# Patient Record
Sex: Male | Born: 1955 | Race: Black or African American | Hispanic: No | Marital: Married | Smoking: Former smoker
Health system: Southern US, Community
[De-identification: ages and names within clinical notes are randomized; demographics above are authoritative.]

## PROBLEM LIST (undated history)

## (undated) DIAGNOSIS — R011 Cardiac murmur, unspecified: Secondary | ICD-10-CM

## (undated) DIAGNOSIS — I10 Essential (primary) hypertension: Secondary | ICD-10-CM

## (undated) DIAGNOSIS — I82409 Acute embolism and thrombosis of unspecified deep veins of unspecified lower extremity: Secondary | ICD-10-CM

## (undated) DIAGNOSIS — M109 Gout, unspecified: Secondary | ICD-10-CM

## (undated) DIAGNOSIS — I2699 Other pulmonary embolism without acute cor pulmonale: Secondary | ICD-10-CM

## (undated) HISTORY — DX: Acute embolism and thrombosis of unspecified deep veins of unspecified lower extremity: I82.409

## (undated) HISTORY — DX: Essential (primary) hypertension: I10

## (undated) HISTORY — DX: Gout, unspecified: M10.9

## (undated) HISTORY — DX: Cardiac murmur, unspecified: R01.1

---

## 2011-09-10 HISTORY — PX: THYROIDECTOMY: SHX17

## 2018-07-09 ENCOUNTER — Encounter: Payer: Self-pay | Admitting: Urology

## 2018-07-09 ENCOUNTER — Ambulatory Visit (INDEPENDENT_AMBULATORY_CARE_PROVIDER_SITE_OTHER): Payer: Medicare Other | Admitting: Urology

## 2018-07-09 ENCOUNTER — Ambulatory Visit: Payer: Self-pay | Admitting: Urology

## 2018-07-09 VITALS — BP 127/79 | HR 76 | Ht 74.0 in | Wt 216.0 lb

## 2018-07-09 DIAGNOSIS — R972 Elevated prostate specific antigen [PSA]: Secondary | ICD-10-CM

## 2018-07-09 LAB — URINALYSIS, COMPLETE
BILIRUBIN UA: NEGATIVE
GLUCOSE, UA: NEGATIVE
Ketones, UA: NEGATIVE
LEUKOCYTES UA: NEGATIVE
Nitrite, UA: NEGATIVE
PROTEIN UA: NEGATIVE
RBC, UA: NEGATIVE
Specific Gravity, UA: >1.03 — ABNORMAL HIGH (ref 1.005–1.030)
Urobilinogen, Ur: 1 mg/dL (ref 0.2–1.0)
pH, UA: 5.5 (ref 5.0–7.5)

## 2018-07-09 NOTE — Progress Notes (Signed)
07/09/2018 6:04 PM   Fernando Cole 02/15/1956 [id number]  Referring provider: Sherron Monday, MD [address] [place], [place] [address]  CC: Elevated PSA  HPI: I had the pleasure of seeing Fernando Cole in urology clinic today in consultation for Dr. Avel Peace for elevated PSA.  He is a 62 year old African-American male with history of DVT/PE on Xarelto who recently moved to the area from [place].  He was found to have a PSA of 6.9 on screening with his PCP.  He was previously followed by urologist in [place], and he reports he underwent a prostate biopsy proximally 3 years ago for a PSA of around 5, which was negative.  He denies any family history of prostate cancer.  He has some mild urinary urgency and frequency, however denies any weak stream or significant urinary symptoms.  He was on Flomax previously, but has since stopped this medication.  There are no aggravating or alleviating factors.  Duration is greater than 3 years.  Severity is mild.  He denies gross hematuria, weight loss, or bone pain.  His outside records from [place] are unavailable to me today.   PMH: Past Medical History:  Diagnosis Date  . 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)   . Gout   . Heart murmur   . Hypertension     Surgical History: Past Surgical History:  Procedure Laterality Date  . THYROIDECTOMY  2013    Allergies: No Known Allergies  Family History: Family History  Problem Relation Age of Onset  . Prolactinoma Neg Hx   . Prostate cancer Neg Hx   . Kidney cancer Neg Hx     Social History:  reports that he has quit smoking. He has never used smokeless tobacco. He reports that he does not drink alcohol or use drugs.  ROS: Please see flowsheet from today's date for complete review of systems.  Physical Exam: BP 127/79   Pulse 76   Ht 6\' 2"  (1.88 m)   Wt 216 lb (98 kg)   BMI 27.73 kg/m    Constitutional:  Alert and oriented, No acute distress. Cardiovascular: No  clubbing, cyanosis, or edema. Respiratory: Normal respiratory effort, no increased work of breathing. GI: Abdomen is soft, nontender, nondistended, no abdominal masses GU: No CVA tenderness DRE: 70 g, smooth, no nodules Lymph: No cervical or inguinal lymphadenopathy. Skin: No rashes, bruises or suspicious lesions. Neurologic: Grossly intact, no focal deficits, moving all 4 extremities. Psychiatric: Normal mood and affect.  Laboratory Data: PSA 6.9  Urinalysis today 0 WBCs, 0 RBCs, nitrite negative, no bacteria  Pertinent Imaging: None to review  Assessment & Plan:   In summary, Fernando Cole is a very nice 62 year old African-American male with no family history of prostate cancer, anticoagulated on Xarelto for history of DVT and PE, with a recent PSA of 6.9, and history of negative prostate biopsy 3 years ago in [place].  We reviewed the implications of an elevated PSA and the uncertainty surrounding it. In general, a man's PSA increases with age and is produced by both normal and cancerous prostate tissue. The differential diagnosis for elevated PSA includes BPH, prostate cancer, infection, recent intercourse/ejaculation, recent urethroscopic manipulation (foley placement/cystoscopy) or trauma, and prostatitis.   Management of an elevated PSA can include observation or prostate biopsy and we discussed this in detail. Our goal is to detect clinically significant prostate cancers, and manage with either active surveillance, surgery, or radiation for localized disease. Risks of prostate biopsy include  bleeding, infection (including life threatening sepsis), pain, and lower urinary symptoms. Hematuria, hematospermia, and blood in the stool are all common after biopsy and can persist up to 4 weeks.   We will obtain his outside urology records from [place] Repeat PSA today with free to total ratio Will call with PSA results after reviewing outside records to determine next steps Will consider  prostate MRI  Return will call with PSA results and follow up plan.  Sondra Come, MD  [facility] [address] [place], [place] [address] [phone]

## 2018-07-10 LAB — PSA, TOTAL AND FREE
PSA FREE PCT: 22 %
PSA, Free: 4.94 ng/mL
Prostate Specific Ag, Serum: 22.5 ng/mL — ABNORMAL HIGH (ref 0.0–4.0)

## 2018-07-15 ENCOUNTER — Telehealth: Payer: Self-pay | Admitting: Family Medicine

## 2018-07-15 NOTE — Telephone Encounter (Signed)
LMOM for patient to return call. Need to know if he filled out Medical Release information.

## 2018-07-15 NOTE — Telephone Encounter (Signed)
I spoke with the patient and he let me know that he never filled out a medical release form so he is coming by the office to fill one out.   Fernando Cole

## 2018-07-15 NOTE — Telephone Encounter (Signed)
-----   Message from Brian C Sninsky, MD sent at 07/14/2018  3:20 PM EST ----- Were we able to obtain his outside urology records from [place] yet? Should include prostate biopsy results. Thanks  Brian Sninsky, MD 07/14/2018  

## 2018-07-15 NOTE — Telephone Encounter (Signed)
-----   Message from Sondra Come, MD sent at 07/14/2018  3:20 PM EST ----- Were we able to obtain his outside urology records from [place] yet? Should include prostate biopsy results. Thanks  Legrand Rams, MD 07/14/2018

## 2018-07-27 NOTE — Addendum Note (Signed)
Addended by: Sondra ComeSNINSKY, Labrina Lines C on: 07/27/2018 03:54 PM   Modules accepted: Orders

## 2018-07-29 NOTE — Telephone Encounter (Signed)
I have called the patient and left  Voicemail for  Him to cb.   Fernando Cole

## 2018-07-29 NOTE — Telephone Encounter (Signed)
-----   Message from Sondra ComeBrian C Sninsky, MD sent at 07/27/2018  3:54 PM EST ----- I have been unable to reach this patient by phone multiple times, and have left 2 voicemails.   Please schedule him for clinic visit with me next available with PSA prior.  Thanks Legrand RamsBrian Sninsky, MD 07/27/2018

## 2018-07-30 ENCOUNTER — Ambulatory Visit: Payer: Self-pay | Admitting: Urology

## 2018-08-18 ENCOUNTER — Other Ambulatory Visit: Payer: Medicare Other

## 2018-08-18 DIAGNOSIS — R972 Elevated prostate specific antigen [PSA]: Secondary | ICD-10-CM

## 2018-08-19 LAB — FPSA% REFLEX
% FREE PSA: 21.3 %
PSA, FREE: 1.77 ng/mL

## 2018-08-19 LAB — PSA TOTAL (REFLEX TO FREE): PROSTATE SPECIFIC AG, SERUM: 8.3 ng/mL — AB (ref 0.0–4.0)

## 2018-08-25 ENCOUNTER — Encounter: Payer: Self-pay | Admitting: Urology

## 2018-08-25 ENCOUNTER — Other Ambulatory Visit: Payer: Self-pay

## 2018-08-25 ENCOUNTER — Ambulatory Visit (INDEPENDENT_AMBULATORY_CARE_PROVIDER_SITE_OTHER): Payer: Medicare Other | Admitting: Urology

## 2018-08-25 VITALS — BP 129/84 | HR 78 | Wt 224.0 lb

## 2018-08-25 DIAGNOSIS — R972 Elevated prostate specific antigen [PSA]: Secondary | ICD-10-CM | POA: Diagnosis not present

## 2018-08-25 NOTE — Progress Notes (Signed)
   08/25/2018 11:08 AM   Hanley HaysJeffery Sinn 11/09/1955 [id number]  Reason for visit: Follow up elevated PSA  I saw Mr. Rosenbloom back in urology clinic today for elevated PSA.  He is a 62 year old African-American male who recently moved to the area from [place].  He does have some comorbidities including history of DVT/PE on Xarelto.  He has had extensive work-up for elevated PSA by his urologist in [place].  We were able to obtain these records.  To briefly summarize, he underwent a negative prostate biopsy in 2017 for an elevated PSA of 5, underwent a 4K score with a 4% risk of finding a clinically significant prostate cancer on repeat biopsy, as well as a negative prostate MRI in 2018 that showed no suspicious lesions.  Prostate volume was 72 cc on prostate MRI.  PSA density is 0.12 with his most recent PSA of 8.3(21% free).  We had a long conversation about PSA screening today.  He has had extensive work-up with his urologist in [place], all of which have been reassuring including 4K score of 4% and negative prostate MRI.  His PSA has been chronically elevated, and the percentage free of 21% is reassuring.  We discussed there is a very low, but not 0 risk of missing a clinically significant prostate cancer by avoiding biopsy at this time.  He is in agreement to continue PSA/DRE screening on a yearly basis.  If PSA rose significantly in the future, would consider repeating prostate MRI prior to undergoing biopsy.  RTC 1 year with PSA/DRE  A total of 15 minutes were spent face-to-face with the patient, greater than 50% was spent in patient education, counseling, and coordination of care regarding PSA screening, role of 4K score and MRI, and need for ongoing surveillance.  Sondra ComeBrian C Sninsky, MD  [facility] [address], [address] [place], [place] [address] [phone]

## 2019-08-23 ENCOUNTER — Other Ambulatory Visit: Payer: Medicare Other

## 2019-08-23 ENCOUNTER — Other Ambulatory Visit: Payer: Self-pay

## 2019-08-23 DIAGNOSIS — R972 Elevated prostate specific antigen [PSA]: Secondary | ICD-10-CM

## 2019-08-24 LAB — PSA: Prostate Specific Ag, Serum: 8.3 ng/mL — ABNORMAL HIGH (ref 0.0–4.0)

## 2019-08-25 ENCOUNTER — Ambulatory Visit: Payer: Medicare Other | Admitting: Urology

## 2019-08-26 ENCOUNTER — Ambulatory Visit (INDEPENDENT_AMBULATORY_CARE_PROVIDER_SITE_OTHER): Payer: Medicare Other | Admitting: Urology

## 2019-08-26 ENCOUNTER — Other Ambulatory Visit: Payer: Self-pay

## 2019-08-26 ENCOUNTER — Encounter: Payer: Self-pay | Admitting: Urology

## 2019-08-26 VITALS — BP 140/76 | HR 82 | Ht 74.0 in | Wt 220.0 lb

## 2019-08-26 DIAGNOSIS — R972 Elevated prostate specific antigen [PSA]: Secondary | ICD-10-CM

## 2019-08-26 NOTE — Patient Instructions (Signed)

## 2019-08-26 NOTE — Progress Notes (Signed)
   08/26/2019 9:41 AM   Fernando Cole 13-Jun-1956 [id number]  Reason for visit: Follow up elevated PSA  HPI: I saw Fernando Cole back in urology clinic today for elevated PSA.  He is a 63 year old African-American male who recently moved to the area from [place] a few years ago.  He does have some comorbidities including history of DVT/PE on Xarelto.  He has had extensive work-up for elevated PSA by his urologist in [place].  To briefly summarize, he underwent a negative prostate biopsy in 2017 for an elevated PSA of 5, underwent a 4K score with a 4% risk of finding a clinically significant prostate cancer on repeat biopsy, as well as a negative prostate MRI in 2018 that showed no suspicious lesions.  Prostate volume was 72 cc on prostate MRI.  PSA density is 0.12 with his most recent PSA of 8.3(21% free).  Most recent PSA on 08/23/2019 is 8.3, which is unchanged from last year.  He denies any complaints from last year including weight loss or bone pain.  DRE is benign today with a 75 g prostate with no masses or nodules  We had another long conversation about PSA screening today.  He has had extensive work-up with his urologist in [place], all of which have been reassuring including 4K score of 4% and negative prostate MRI.  His PSA has been chronically elevated, and the percentage free of 21% is reassuring.  We discussed there is a very low, but not 0 risk of missing a clinically significant prostate cancer by avoiding biopsy at this time.  He is in agreement to continue PSA/DRE screening on a yearly basis.  If PSA rose significantly in the future, would consider repeating prostate MRI prior to undergoing biopsy.  Repeat PSA with free/total ratio in 1 year  A total of 15 minutes were spent face-to-face with the patient, greater than 50% was spent in patient education, counseling, and coordination of care regarding PSA screening.  Billey Co, [facility] [address] [place],  [address] [phone]

## 2020-08-28 ENCOUNTER — Ambulatory Visit: Payer: Medicare Other | Admitting: Urology

## 2020-08-30 ENCOUNTER — Ambulatory Visit: Payer: Medicare Other | Admitting: Urology

## 2020-08-30 ENCOUNTER — Other Ambulatory Visit: Payer: Medicare Other

## 2020-08-30 ENCOUNTER — Other Ambulatory Visit: Payer: Self-pay

## 2020-08-30 DIAGNOSIS — R972 Elevated prostate specific antigen [PSA]: Secondary | ICD-10-CM

## 2020-08-31 LAB — PSA: Prostate Specific Ag, Serum: 23.7 ng/mL — ABNORMAL HIGH (ref 0.0–4.0)

## 2020-09-06 ENCOUNTER — Other Ambulatory Visit: Payer: Self-pay

## 2020-09-06 ENCOUNTER — Ambulatory Visit (INDEPENDENT_AMBULATORY_CARE_PROVIDER_SITE_OTHER): Payer: Medicare Other | Admitting: Urology

## 2020-09-06 ENCOUNTER — Encounter: Payer: Self-pay | Admitting: Urology

## 2020-09-06 VITALS — BP 129/73 | HR 76 | Ht 74.0 in | Wt 217.0 lb

## 2020-09-06 DIAGNOSIS — R972 Elevated prostate specific antigen [PSA]: Secondary | ICD-10-CM | POA: Diagnosis not present

## 2020-09-06 DIAGNOSIS — R3915 Urgency of urination: Secondary | ICD-10-CM

## 2020-09-06 LAB — MICROSCOPIC EXAMINATION
Bacteria, UA: NONE SEEN
Epithelial Cells (non renal): NONE SEEN /hpf (ref 0–10)

## 2020-09-06 LAB — URINALYSIS, COMPLETE
Bilirubin, UA: NEGATIVE
Glucose, UA: NEGATIVE
Ketones, UA: NEGATIVE
Leukocytes,UA: NEGATIVE
Nitrite, UA: NEGATIVE
Protein,UA: NEGATIVE
Specific Gravity, UA: 1.025 (ref 1.005–1.030)
Urobilinogen, Ur: 1 mg/dL (ref 0.2–1.0)
pH, UA: 5 (ref 5.0–7.5)

## 2020-09-06 LAB — BLADDER SCAN AMB NON-IMAGING

## 2020-09-06 NOTE — Progress Notes (Signed)
° °  09/06/2020 2:17 PM   Fernando Cole 1956/05/04 [id number]  Reason for visit: Follow up elevated PSA, urinary symptoms  HPI: He is a 64 year old African-American male from [place] with history of DVT/PE on Xarelto and long history of elevated PSA and extensive work-up. To briefly summarize, he underwent a negative prostate biopsy in 2017 for an elevated PSA of 5, underwent a 4K score with a 4% risk of finding a clinically significant prostate cancer on repeat biopsy, as well as a negative prostate MRI in 2018 that showed no suspicious lesions. Prostate volume was 72 cc on prostate MRI. PSA density is 0.12 with his PSA last year of 8.3(21% free).   Repeat PSA on 08/30/2020 was elevated at 23.7.  He does have a history of isolated elevated PSA, including a value of 22.5 on 07/09/2018 that dropped down to 8.3 on repeat in December 2019.  We discussed possible etiologies of a false elevation of the PSA.  DRE is benign today showing a 70 g gland with no nodules or masses.  He reports some mild intermittent urinary symptoms of occasional urgency and frequency, but denies any dysuria or gross hematuria.  He does think that he had multiple ejaculations prior to having his PSA drawn most recently.  Urinalysis today was benign with 0-5 WBCs, 0-2 RBCs, no bacteria, nitrite negative, no leukocytes.  PVR is normal at 8 mL.  We again reviewed his extensive work-up for history of elevated PSA including negative biopsy in 2017, 4K score with 4% risk of clinically significant prostate cancer, and negative prostate MRI in 2018.  DRE is additionally benign.  Suspect false elevation of PSA, and recommended a repeat in 4 to 6 weeks.  I encouraged him to abstain from ejaculations for at least 2 to 3 days prior to PSA lab draw.  Repeat PSA in 6 weeks, call with results   Sondra Come, MD  [facility] [address], [address] [place], [place] [address] [phone]

## 2020-09-06 NOTE — Patient Instructions (Addendum)
Prostate Cancer Screening  Prostate cancer screening is a test that is done to check for the presence of prostate cancer in men. The prostate gland is a walnut-sized gland that is located below the bladder and in front of the rectum in males. The function of the prostate is to add fluid to semen during ejaculation. Prostate cancer is the second most common type of cancer in men. Who should have prostate cancer screening?  Screening recommendations vary based on age and other risk factors. Screening is recommended if:  You are older than age 55. If you are age 55-69, talk with your health care provider about your need for screening and how often screening should be done. Because most prostate cancers are slow growing and will not cause death, screening is generally reserved in this age group for men who have a 10-15-year life expectancy.  You are younger than age 55, and you have these risk factors: ? Being a black male or a male of African descent. ? Having a father, brother, or uncle who has been diagnosed with prostate cancer. The risk is higher if your family member's cancer occurred at an early age. Screening is not recommended if:  You are younger than age 40.  You are between the ages of 40 and 54 and you have no risk factors.  You are 70 years of age or older. At this age, the risks that screening can cause are greater than the benefits that it may provide. If you are at high risk for prostate cancer, your health care provider may recommend that you have screenings more often or that you start screening at a younger age. How is screening for prostate cancer done? The recommended prostate cancer screening test is a blood test called the prostate-specific antigen (PSA) test. PSA is a protein that is made in the prostate. As you age, your prostate naturally produces more PSA. Abnormally high PSA levels may be caused by:  Prostate cancer.  An enlarged prostate that is not caused by cancer  (benign prostatic hyperplasia, BPH). This condition is very common in older men.  A prostate gland infection (prostatitis). Depending on the PSA results, you may need more tests, such as:  A physical exam to check the size of your prostate gland.  Blood and imaging tests.  A procedure to remove tissue samples from your prostate gland for testing (biopsy). What are the benefits of prostate cancer screening?  Screening can help to identify cancer at an early stage, before symptoms start and when the cancer can be treated more easily.  There is a small chance that screening may lower your risk of dying from prostate cancer. The chance is small because prostate cancer is a slow-growing cancer, and most men with prostate cancer die from a different cause. What are the risks of prostate cancer screening? The main risk of prostate cancer screening is diagnosing and treating prostate cancer that would never have caused any symptoms or problems. This is called overdiagnosisand overtreatment. PSA screening cannot tell you if your PSA is high due to cancer or a different cause. A prostate biopsy is the only procedure to diagnose prostate cancer. Even the results of a biopsy may not tell you if your cancer needs to be treated. Slow-growing prostate cancer may not need any treatment other than monitoring, so diagnosing and treating it may cause unnecessary stress or other side effects. A prostate biopsy may also cause:  Infection or fever.  A false negative. This is   a result that shows that you do not have prostate cancer when you actually do have prostate cancer. Questions to ask your health care provider  When should I start prostate cancer screening?  What is my risk for prostate cancer?  How often do I need screening?  What type of screening tests do I need?  How do I get my test results?  What do my results mean?  Do I need treatment? Where to find more information  The American Cancer  Society: www.cancer.org  American Urological Association: www.auanet.org Contact a health care provider if:  You have difficulty urinating.  You have pain when you urinate or ejaculate.  You have blood in your urine or semen.  You have pain in your back or in the area of your prostate. Summary  Prostate cancer is a common type of cancer in men. The prostate gland is located below the bladder and in front of the rectum. This gland adds fluid to semen during ejaculation.  Prostate cancer screening may identify cancer at an early stage, when the cancer can be treated more easily.  The prostate-specific antigen (PSA) test is the recommended screening test for prostate cancer.  Discuss the risks and benefits of prostate cancer screening with your health care provider. If you are age 70 or older, the risks that screening can cause are greater than the benefits that it may provide. This information is not intended to replace advice given to you by your health care provider. Make sure you discuss any questions you have with your health care provider. Document Revised: 04/08/2019 Document Reviewed: 04/08/2019 Elsevier Patient Education  2020 Elsevier Inc.  Prostate Cancer Screening  Prostate cancer screening is a test that is done to check for the presence of prostate cancer in men. The prostate gland is a walnut-sized gland that is located below the bladder and in front of the rectum in males. The function of the prostate is to add fluid to semen during ejaculation. Prostate cancer is the second most common type of cancer in men. Who should have prostate cancer screening?  Screening recommendations vary based on age and other risk factors. Screening is recommended if:  You are older than age 55. If you are age 55-69, talk with your health care provider about your need for screening and how often screening should be done. Because most prostate cancers are slow growing and will not cause  death, screening is generally reserved in this age group for men who have a 10-15-year life expectancy.  You are younger than age 55, and you have these risk factors: ? Being a black male or a male of African descent. ? Having a father, brother, or uncle who has been diagnosed with prostate cancer. The risk is higher if your family member's cancer occurred at an early age. Screening is not recommended if:  You are younger than age 40.  You are between the ages of 40 and 54 and you have no risk factors.  You are 70 years of age or older. At this age, the risks that screening can cause are greater than the benefits that it may provide. If you are at high risk for prostate cancer, your health care provider may recommend that you have screenings more often or that you start screening at a younger age. How is screening for prostate cancer done? The recommended prostate cancer screening test is a blood test called the prostate-specific antigen (PSA) test. PSA is a protein that is made in   the prostate. As you age, your prostate naturally produces more PSA. Abnormally high PSA levels may be caused by:  Prostate cancer.  An enlarged prostate that is not caused by cancer (benign prostatic hyperplasia, BPH). This condition is very common in older men.  A prostate gland infection (prostatitis). Depending on the PSA results, you may need more tests, such as:  A physical exam to check the size of your prostate gland.  Blood and imaging tests.  A procedure to remove tissue samples from your prostate gland for testing (biopsy). What are the benefits of prostate cancer screening?  Screening can help to identify cancer at an early stage, before symptoms start and when the cancer can be treated more easily.  There is a small chance that screening may lower your risk of dying from prostate cancer. The chance is small because prostate cancer is a slow-growing cancer, and most men with prostate cancer die  from a different cause. What are the risks of prostate cancer screening? The main risk of prostate cancer screening is diagnosing and treating prostate cancer that would never have caused any symptoms or problems. This is called overdiagnosisand overtreatment. PSA screening cannot tell you if your PSA is high due to cancer or a different cause. A prostate biopsy is the only procedure to diagnose prostate cancer. Even the results of a biopsy may not tell you if your cancer needs to be treated. Slow-growing prostate cancer may not need any treatment other than monitoring, so diagnosing and treating it may cause unnecessary stress or other side effects. A prostate biopsy may also cause:  Infection or fever.  A false negative. This is a result that shows that you do not have prostate cancer when you actually do have prostate cancer. Questions to ask your health care provider  When should I start prostate cancer screening?  What is my risk for prostate cancer?  How often do I need screening?  What type of screening tests do I need?  How do I get my test results?  What do my results mean?  Do I need treatment? Where to find more information  The American Cancer Society: www.cancer.org  American Urological Association: www.auanet.org Contact a health care provider if:  You have difficulty urinating.  You have pain when you urinate or ejaculate.  You have blood in your urine or semen.  You have pain in your back or in the area of your prostate. Summary  Prostate cancer is a common type of cancer in men. The prostate gland is located below the bladder and in front of the rectum. This gland adds fluid to semen during ejaculation.  Prostate cancer screening may identify cancer at an early stage, when the cancer can be treated more easily.  The prostate-specific antigen (PSA) test is the recommended screening test for prostate cancer.  Discuss the risks and benefits of prostate  cancer screening with your health care provider. If you are age 70 or older, the risks that screening can cause are greater than the benefits that it may provide. This information is not intended to replace advice given to you by your health care provider. Make sure you discuss any questions you have with your health care provider. Document Revised: 04/08/2019 Document Reviewed: 04/08/2019 Elsevier Patient Education  2020 Elsevier Inc.  

## 2020-09-07 ENCOUNTER — Telehealth: Payer: Self-pay

## 2020-09-07 NOTE — Telephone Encounter (Signed)
Called pt informed him of the information below. Pt gave verbal understanding.  

## 2020-09-07 NOTE — Telephone Encounter (Signed)
-----   Message from Sondra Come, MD sent at 09/07/2020  8:35 AM EST ----- No evidence of infection on urinalysis, keep follow-up as scheduled for repeat PSA  Legrand Rams, MD 09/07/2020

## 2020-09-17 ENCOUNTER — Other Ambulatory Visit: Payer: Self-pay

## 2020-09-17 DIAGNOSIS — Z7901 Long term (current) use of anticoagulants: Secondary | ICD-10-CM | POA: Insufficient documentation

## 2020-09-17 DIAGNOSIS — I10 Essential (primary) hypertension: Secondary | ICD-10-CM | POA: Insufficient documentation

## 2020-09-17 DIAGNOSIS — Z86711 Personal history of pulmonary embolism: Secondary | ICD-10-CM | POA: Diagnosis not present

## 2020-09-17 DIAGNOSIS — M25462 Effusion, left knee: Secondary | ICD-10-CM | POA: Insufficient documentation

## 2020-09-17 DIAGNOSIS — Z86718 Personal history of other venous thrombosis and embolism: Secondary | ICD-10-CM | POA: Insufficient documentation

## 2020-09-17 DIAGNOSIS — M25562 Pain in left knee: Secondary | ICD-10-CM | POA: Diagnosis present

## 2020-09-17 DIAGNOSIS — Z79899 Other long term (current) drug therapy: Secondary | ICD-10-CM | POA: Diagnosis not present

## 2020-09-17 DIAGNOSIS — Z87891 Personal history of nicotine dependence: Secondary | ICD-10-CM | POA: Diagnosis not present

## 2020-09-18 ENCOUNTER — Emergency Department: Payer: Medicare Other

## 2020-09-18 ENCOUNTER — Encounter: Payer: Self-pay | Admitting: Emergency Medicine

## 2020-09-18 ENCOUNTER — Emergency Department
Admission: EM | Admit: 2020-09-18 | Discharge: 2020-09-18 | Disposition: A | Discharge status: Home / Self Care | Payer: Medicare Other | Attending: Emergency Medicine | Admitting: Emergency Medicine

## 2020-09-18 DIAGNOSIS — M25462 Effusion, left knee: Secondary | ICD-10-CM

## 2020-09-18 HISTORY — DX: Other pulmonary embolism without acute cor pulmonale: I26.99

## 2020-09-18 LAB — CBC WITH DIFFERENTIAL/PLATELET
Abs Immature Granulocytes: 0.04 10*3/uL (ref 0.00–0.07)
Basophils Absolute: 0 10*3/uL (ref 0.0–0.1)
Basophils Relative: 0 %
Eosinophils Absolute: 0 10*3/uL (ref 0.0–0.5)
Eosinophils Relative: 0 %
HCT: 39.8 % (ref 39.0–52.0)
Hemoglobin: 13.7 g/dL (ref 13.0–17.0)
Immature Granulocytes: 0 %
Lymphocytes Relative: 21 %
Lymphs Abs: 2.2 10*3/uL (ref 0.7–4.0)
MCH: 32.8 pg (ref 26.0–34.0)
MCHC: 34.4 g/dL (ref 30.0–36.0)
MCV: 95.2 fL (ref 80.0–100.0)
Monocytes Absolute: 0.9 10*3/uL (ref 0.1–1.0)
Monocytes Relative: 8 %
Neutro Abs: 7.5 10*3/uL (ref 1.7–7.7)
Neutrophils Relative %: 71 %
Platelets: 287 10*3/uL (ref 150–400)
RBC: 4.18 MIL/uL — ABNORMAL LOW (ref 4.22–5.81)
RDW: 12.3 % (ref 11.5–15.5)
WBC: 10.7 10*3/uL — ABNORMAL HIGH (ref 4.0–10.5)
nRBC: 0 % (ref 0.0–0.2)

## 2020-09-18 LAB — SYNOVIAL CELL COUNT + DIFF, W/ CRYSTALS
Crystals, Fluid: NONE SEEN
Eosinophils-Synovial: 0 %
Lymphocytes-Synovial Fld: 49 %
Monocyte-Macrophage-Synovial Fluid: 2 %
Neutrophil, Synovial: 49 %
WBC, Synovial: 494 /mm3 — ABNORMAL HIGH (ref 0–200)

## 2020-09-18 LAB — BASIC METABOLIC PANEL
Anion gap: 10 (ref 5–15)
BUN: 13 mg/dL (ref 8–23)
CO2: 29 mmol/L (ref 22–32)
Calcium: 9.4 mg/dL (ref 8.9–10.3)
Chloride: 100 mmol/L (ref 98–111)
Creatinine, Ser: 1.16 mg/dL (ref 0.61–1.24)
GFR, Estimated: >60 mL/min (ref >60)
Glucose, Bld: 104 mg/dL — ABNORMAL HIGH (ref 70–99)
Potassium: 3.6 mmol/L (ref 3.5–5.1)
Sodium: 139 mmol/L (ref 135–145)

## 2020-09-18 LAB — C-REACTIVE PROTEIN: CRP: 1.7 mg/dL — ABNORMAL HIGH (ref <1.0)

## 2020-09-18 LAB — SEDIMENTATION RATE: Sed Rate: 17 mm/hr — ABNORMAL HIGH (ref 0–16)

## 2020-09-18 LAB — URIC ACID: Uric Acid, Serum: 8.2 mg/dL (ref 3.7–8.6)

## 2020-09-18 MED ORDER — CEPHALEXIN 500 MG PO CAPS
500.0000 mg | ORAL_CAPSULE | Freq: Once | ORAL | Status: AC
  Administered 2020-09-18: 500 mg via ORAL
  Filled 2020-09-18: qty 1

## 2020-09-18 MED ORDER — LIDOCAINE HCL (PF) 1 % IJ SOLN
5.0000 mL | Freq: Once | INTRAMUSCULAR | Status: AC
  Administered 2020-09-18: 5 mL via INTRADERMAL
  Filled 2020-09-18: qty 5

## 2020-09-18 MED ORDER — OXYCODONE-ACETAMINOPHEN 5-325 MG PO TABS
1.0000 | ORAL_TABLET | Freq: Once | ORAL | Status: AC
  Administered 2020-09-18: 1 via ORAL
  Filled 2020-09-18: qty 1

## 2020-09-18 MED ORDER — TRAMADOL HCL 50 MG PO TABS
50.0000 mg | ORAL_TABLET | Freq: Four times a day (QID) | ORAL | 0 refills | Status: AC | PRN
Start: 2020-09-18 — End: 2021-09-18

## 2020-09-18 MED ORDER — DICLOFENAC SODIUM 1 % EX GEL: 2.0000 g | Freq: Four times a day (QID) | CUTANEOUS | 0 refills | Status: DC

## 2020-09-18 MED ORDER — CEPHALEXIN 500 MG PO CAPS: 500.0000 mg | ORAL_CAPSULE | Freq: Three times a day (TID) | ORAL | 0 refills | Status: AC

## 2020-09-18 NOTE — ED Provider Notes (Signed)
[facility] Emergency Department Provider Note  ____________________________________________  Time seen: Approximately 4:41 AM  I have reviewed the triage vital signs and the nursing notes.   HISTORY  Chief Complaint Leg Pain   HPI Fernando Cole is a 65 y.o. 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  Patient reports 2 days of progressively worsening swelling, redness and pain of his left knee.  He is still able to ambulate and bear weight but the pain is worse with movement of the joint.  No fever or chills.  Patient has a history of gout but has never had a flare in his knee before.  Has had it in his foot and ankle in the past.  No nausea or vomiting, no chills.  Patient Dors is compliance with his Xarelto.  No chest pain or shortness of breath.  Past Medical History:  Diagnosis Date  . 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)   . Gout   . Heart murmur   . Hypertension   . PE (pulmonary thromboembolism) (HCC)     There are no problems to display for this patient.   Past Surgical History:  Procedure Laterality Date  . THYROIDECTOMY  2013    Prior to Admission medications   Medication Sig Start Date End Date Taking? Authorizing Provider  cephALEXin (KEFLEX) 500 MG capsule Take 1 capsule (500 mg total) by mouth 3 (three) times daily for 7 days. 09/18/20 09/25/20 Yes Zenola Dezarn, [place], MD  diclofenac Sodium (VOLTAREN) 1 % GEL Apply 2 g topically 4 (four) times daily. 09/18/20  Yes Jakyron Fabro, [place], MD  traMADol (ULTRAM) 50 MG tablet Take 1 tablet (50 mg total) by mouth every 6 (six) hours as needed. 09/18/20 09/18/21 Yes Jadavion Spoelstra, [place], MD  amLODipine-benazepril (LOTREL) 5-40 MG capsule Take 1 capsule by mouth daily.    [provider]  folic acid (FOLVITE) 800 MCG tablet Take 400 mcg by mouth daily.    [provider]  hydrALAZINE (APRESOLINE) 25 MG tablet Take  25 mg by mouth 3 (three) times daily.    [provider]  levothyroxine (SYNTHROID, LEVOTHROID) 100 MCG tablet Take 100 mcg by mouth daily before breakfast.    [provider]  rivaroxaban (XARELTO) 10 MG TABS tablet Take 10 mg by mouth daily.    [provider]  VITAMIN D PO Take by mouth.    [provider]    Allergies Patient has no known allergies.  Family History  Problem Relation Age of Onset  . Prolactinoma Neg Hx   . Prostate cancer Neg Hx   . Kidney cancer Neg Hx     Social History Social History   Tobacco Use  . Smoking status: Former Games developermoker  . Smokeless tobacco: Never Used  Substance Use Topics  . Alcohol use: Never  . Drug use: Never    Review of Systems  Constitutional: Negative for fever. Eyes: Negative for visual changes. ENT: Negative for sore throat. Neck: No neck pain  Cardiovascular: Negative for chest pain. Respiratory: Negative for shortness of breath. Gastrointestinal: Negative for abdominal pain, vomiting or diarrhea. Genitourinary: Negative for dysuria. Musculoskeletal: Negative for back pain. + L knee swelling and pain Skin: Negative for rash. Neurological: Negative for headaches, weakness or numbness. Psych: No SI or HI  ____________________________________________   PHYSICAL EXAM:  VITAL SIGNS: Vitals:   09/18/20 0002 09/18/20 0350  BP: (!) 144/120 (!) 142/78  Pulse: Marland Kitchen(!)  118 86  Resp: 18 18  Temp: 98.7 F (37.1 C)   SpO2: 94% 96%    Constitutional: Alert and oriented. Well appearing and in no apparent distress. HEENT:      Head: Normocephalic and atraumatic.         Eyes: Conjunctivae are normal. Sclera is non-icteric.       Mouth/Throat: Mucous membranes are moist.       Neck: Supple with no signs of meningismus. Cardiovascular: Regular rate and rhythm. No murmurs, gallops, or rubs. 2+ symmetrical distal pulses are present in all extremities. No JVD. Respiratory: Normal respiratory  effort. Lungs are clear to auscultation bilaterally. No wheezes, crackles, or rhonchi.  Gastrointestinal: Soft, non tender. Musculoskeletal: L knee is swollen with erythema and warmth, ROM is slightly reduced due to pain but patient able to fully straighten the leg and bend to 90 degrees. Neurologic: Normal speech and language. Face is symmetric. Moving all extremities. No gross focal neurologic deficits are appreciated. Skin: Skin is warm, dry and intact. No rash noted. Psychiatric: Mood and affect are normal. Speech and behavior are normal.  ____________________________________________   LABS (all labs ordered are listed, but only abnormal results are displayed)  Labs Reviewed  SYNOVIAL CELL COUNT + DIFF, W/ CRYSTALS - Abnormal; Notable for the following components:      Result Value   Color, Synovial RED (*)    Appearance-Synovial CLOUDY (*)    WBC, Synovial 494 (*)    All other components within normal limits  CBC WITH DIFFERENTIAL/PLATELET - Abnormal; Notable for the following components:   WBC 10.7 (*)    RBC 4.18 (*)    All other components within normal limits  BASIC METABOLIC PANEL - Abnormal; Notable for the following components:   Glucose, Bld 104 (*)    All other components within normal limits  BODY FLUID CULTURE  URIC ACID  SEDIMENTATION RATE  C-REACTIVE PROTEIN   ____________________________________________  EKG  none  ____________________________________________  RADIOLOGY  I have personally reviewed the images performed during this visit and I agree with the Radiologist's read.   Interpretation by Radiologist:  US Venous Img Lower Unilateral Left  Result Date: 09/18/2020 CLINICAL DATA:  Initial evaluation for acute pain and swelling. EXAM: LEFT LOWER EXTREMITY VENOUS DOPPLER ULTRASOUND TECHNIQUE: Gray-scale sonography with graded compression, as well as color Doppler and duplex ultrasound were performed to evaluate the lower extremity deep venous systems  from the level of the common femoral vein and including the common femoral, femoral, profunda femoral, popliteal and calf veins including the posterior tibial, peroneal and gastrocnemius veins when visible. The superficial great saphenous vein was also interrogated. Spectral Doppler was utilized to evaluate flow at rest and with distal augmentation maneuvers in the common femoral, femoral and popliteal veins. COMPARISON:  None. FINDINGS: Contralateral Common Femoral Vein: Respiratory phasicity is normal and symmetric with the symptomatic side. No evidence of thrombus. Normal compressibility. Common Femoral Vein: No evidence of thrombus. Normal compressibility, respiratory phasicity and response to augmentation. Saphenofemoral Junction: No evidence of thrombus. Normal compressibility and flow on color Doppler imaging. Profunda Femoral Vein: No evidence of thrombus. Normal compressibility and flow on color Doppler imaging. Femoral Vein: No evidence of thrombus. Normal compressibility, respiratory phasicity and response to augmentation. Popliteal Vein: No evidence of acute thrombus. Normal compressibility. Linear echogenic density seen within the left popliteal vein could reflect a fibrinous band, possibly related to prior thrombus (image 21). Calf Veins: No evidence of thrombus. Normal compressibility and flow on color  Doppler imaging. Superficial Great Saphenous Vein: No evidence of thrombus. Normal compressibility. Venous Reflux:  None. Other Findings: Trace fluid noted about the knee, suspected to reflect a small joint effusion. IMPRESSION: 1. No evidence of acute deep venous thrombosis. 2. Trace fluid about the left knee, suspected to reflect a small joint effusion. Electronically Signed   By: Rise Mu M.D.   On: 09/18/2020 01:08     ____________________________________________   PROCEDURES  Procedure(s) performed:yes .Joint Aspiration/Arthrocentesis  Date/Time: 09/18/2020 4:44 AM Performed  by: Nita Sickle, MD Authorized by: Nita Sickle, MD   Consent:    Consent obtained:  Verbal   Consent given by:  Patient   Risks, benefits, and alternatives were discussed: yes     Risks discussed:  Bleeding, incomplete drainage, pain, nerve damage and infection   Alternatives discussed:  Observation Universal protocol:    Procedure explained and questions answered to patient or proxy's satisfaction: yes     Imaging studies available: yes     Patient identity confirmed:  Verbally with patient Location:    Location:  Knee   Knee:  L knee Anesthesia:    Anesthesia method:  Local infiltration   Local anesthetic:  Lidocaine 1% w/o epi Procedure details:    Preparation: Patient was prepped and draped in usual sterile fashion     Needle gauge:  22 G   Ultrasound guidance: yes     Approach:  Lateral   Aspirate amount:  3   Aspirate characteristics:  Yellow   Steroid injected: no     Specimen collected: yes   Post-procedure details:    Dressing:  Adhesive bandage   Procedure completion:  Tolerated   Critical Care performed:  None ____________________________________________   INITIAL IMPRESSION / ASSESSMENT AND PLAN / ED COURSE  65 y.o. 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  Patient with swelling, erythema and warmth of the left knee with slightly diminished range of motion due to pain.  Afebrile with normal vital signs otherwise.  Ddx gout versus inflammatory joint versus septic joint versus hemarthrosis versus DVT. Patient with pain with ROM therefore concerning more for a joint pathology than cellulitis although erythema and warmth could be due to cellulitis.  Doppler negative for DVT, visualized by me and confirmed by radiology.  Arthrocentesis done per procedure note above with yellow aspirate and a small amount of blood.  Sent to lab for cell count, crystal count, and culture. Will give  percocet for pain. Basic labs pending. Old medical records reviewed.   _________________________ 6:05 AM on 09/18/2020 -----------------------------------------  No crystals seen. Normal uric acid. WBC 494 therefore not consistent with inflammatory or septic joint. Fluid sent for culture. Will put patient on keflex in case this is a skin infection, voltaren gel/ tylenol/ tramadol for pain. Discussed close fu with PCP and my standard return precautions.      _____________________________________________ Please note:  Patient was evaluated in Emergency Department today for the symptoms described in the history of present illness. Patient was evaluated in the context of the global COVID-19 pandemic, which necessitated consideration that the patient might be at risk for infection with the SARS-CoV-2 virus that causes COVID-19. Institutional protocols and algorithms that pertain to the evaluation of patients at risk for COVID-19 are in a state of rapid change based on information released by regulatory bodies including the CDC and federal and state organizations. These policies and algorithms were followed during  the patient's care in the ED.  Some ED evaluations and interventions may be delayed as a result of limited staffing during the pandemic.   [place] Controlled Substance Database was reviewed by me. ____________________________________________   FINAL CLINICAL IMPRESSION(S) / ED DIAGNOSES   Final diagnoses:  Effusion of left knee      NEW MEDICATIONS STARTED DURING THIS VISIT:  ED Discharge Orders         Ordered    diclofenac Sodium (VOLTAREN) 1 % GEL  4 times daily        09/18/20 0602    traMADol (ULTRAM) 50 MG tablet  Every 6 hours PRN        09/18/20 0602    cephALEXin (KEFLEX) 500 MG capsule  3 times daily        09/18/20 0602           Note:  This document was prepared using Dragon voice recognition software and may include unintentional dictation errors.     Nita Sickle, MD 09/18/20 [phone]

## 2020-09-18 NOTE — ED Triage Notes (Signed)
Patient with pain and swelling behind left knee that started on Friday. Patient has a history of DVT and PE. Patient is currently taking blood thinners.

## 2020-09-18 NOTE — Discharge Instructions (Addendum)
The liquid from the knee did not show any signs of infection or gout. It could be just an inflammation of the knee or skin infection overlying the knee. Take keflex as prescribed 3 times a day for 7 days. Apply voltaren gel up to 4 times a day and massage it over the knee for pain relief. Take 1000mg  of tylenol 3 times a day for pain and 50mg  of tramadol every 4 hours as needed for breakthrough pain. Follow up with your doctor in 3 days for re-evaluation. Return to the ER for worsening pain, swelling, redness, or fever.

## 2020-09-30 LAB — BODY FLUID CULTURE

## 2020-10-19 ENCOUNTER — Other Ambulatory Visit: Payer: Self-pay

## 2020-10-31 ENCOUNTER — Other Ambulatory Visit: Payer: Medicare Other

## 2020-10-31 ENCOUNTER — Other Ambulatory Visit: Payer: Self-pay

## 2020-10-31 DIAGNOSIS — R972 Elevated prostate specific antigen [PSA]: Secondary | ICD-10-CM

## 2020-11-01 LAB — FPSA% REFLEX
% FREE PSA: 20.7 %
PSA, FREE: 1.84 ng/mL

## 2020-11-01 LAB — PSA TOTAL (REFLEX TO FREE): Prostate Specific Ag, Serum: 8.9 ng/mL — ABNORMAL HIGH (ref 0.0–4.0)

## 2020-11-02 ENCOUNTER — Telehealth: Payer: Self-pay

## 2020-11-02 NOTE — Telephone Encounter (Signed)
Called pt informed him of the information below. Pt gave verbal understanding. Appt previously scheduled.

## 2020-11-02 NOTE — Telephone Encounter (Signed)
-----   Message from Sondra Come, MD sent at 11/02/2020  7:29 AM EST ----- Randie Heinz news, PSA back down to his baseline of 8, keep schedule follow up in December for IPSS and PSA, thanks  Legrand Rams, MD 11/02/2020

## 2021-08-27 ENCOUNTER — Other Ambulatory Visit: Payer: Medicare Other

## 2021-08-30 ENCOUNTER — Other Ambulatory Visit: Payer: Self-pay

## 2021-08-30 ENCOUNTER — Other Ambulatory Visit: Payer: Medicare Other

## 2021-08-30 DIAGNOSIS — R972 Elevated prostate specific antigen [PSA]: Secondary | ICD-10-CM

## 2021-08-31 ENCOUNTER — Other Ambulatory Visit: Payer: Self-pay

## 2021-08-31 LAB — FPSA% REFLEX
% FREE PSA: 20.2 %
PSA, FREE: 1.92 ng/mL

## 2021-08-31 LAB — PSA TOTAL (REFLEX TO FREE): Prostate Specific Ag, Serum: 9.5 ng/mL — ABNORMAL HIGH (ref 0.0–4.0)

## 2021-09-05 ENCOUNTER — Ambulatory Visit: Payer: Self-pay | Admitting: Urology

## 2021-09-07 ENCOUNTER — Encounter: Payer: Self-pay | Admitting: Urology

## 2021-09-07 ENCOUNTER — Ambulatory Visit (INDEPENDENT_AMBULATORY_CARE_PROVIDER_SITE_OTHER): Payer: Medicare Other | Admitting: Urology

## 2021-09-07 ENCOUNTER — Other Ambulatory Visit: Payer: Self-pay

## 2021-09-07 VITALS — BP 142/83 | HR 69 | Ht 74.0 in | Wt 215.8 lb

## 2021-09-07 DIAGNOSIS — R3915 Urgency of urination: Secondary | ICD-10-CM | POA: Diagnosis not present

## 2021-09-07 DIAGNOSIS — N529 Male erectile dysfunction, unspecified: Secondary | ICD-10-CM | POA: Diagnosis not present

## 2021-09-07 DIAGNOSIS — R972 Elevated prostate specific antigen [PSA]: Secondary | ICD-10-CM

## 2021-09-07 LAB — URINALYSIS, COMPLETE
Bilirubin, UA: NEGATIVE
Glucose, UA: NEGATIVE
Ketones, UA: NEGATIVE
Leukocytes,UA: NEGATIVE
Nitrite, UA: NEGATIVE
Protein,UA: NEGATIVE
RBC, UA: NEGATIVE
Specific Gravity, UA: 1.025 (ref 1.005–1.030)
Urobilinogen, Ur: 0.2 mg/dL (ref 0.2–1.0)
pH, UA: 5.5 (ref 5.0–7.5)

## 2021-09-07 LAB — MICROSCOPIC EXAMINATION
Bacteria, UA: NONE SEEN
RBC, Urine: NONE SEEN /hpf (ref 0–2)

## 2021-09-07 MED ORDER — TADALAFIL 5 MG PO TABS: 5.0000 mg | ORAL_TABLET | Freq: Every day | ORAL | 6 refills | Status: DC | PRN

## 2021-09-07 NOTE — Patient Instructions (Signed)
Prostate Cancer Screening ?Prostate cancer screening is testing that is done to check for the presence of prostate cancer in men. The prostate gland is a walnut-sized gland that is located below the bladder and in front of the rectum in males. The function of the prostate is to add fluid to semen during ejaculation. Prostate cancer is one of the most common types of cancer in men. ?Who should have prostate cancer screening? ?Screening recommendations vary based on age and other risk factors, as well as between the professional organizations who make the recommendations. ?In general, screening is recommended if: ?You are age 50 to 70 and have an average risk for prostate cancer. You should talk with your health care provider about your need for screening and how often screening should be done. Because most prostate cancers are slow growing and will not cause death, screening in this age group is generally reserved for men who have a 10- to 15-year life expectancy. ?You are younger than age 50, and you have these risk factors: ?Having a father, brother, or uncle who has been diagnosed with prostate cancer. The risk is higher if your family member's cancer occurred at an early age or if you have multiple family members with prostate cancer at an early age. ?Being a male who is Black or is of Caribbean or sub-Saharan African descent. ?In general, screening is not recommended if: ?You are younger than age 40. ?You are between the ages of 40 and 49 and you have no risk factors. ?You are 70 years of age or older. At this age, the risks that screening can cause are greater than the benefits that it may provide. ?If you are at high risk for prostate cancer, your health care provider may recommend that you have screenings more often or that you start screening at a younger age. ?How is screening for prostate cancer done? ?The recommended prostate cancer screening test is a blood test called the prostate-specific antigen (PSA)  test. PSA is a protein that is made in the prostate. As you age, your prostate naturally produces more PSA. Abnormally high PSA levels may be caused by: ?Prostate cancer. ?An enlarged prostate that is not caused by cancer (benign prostatic hyperplasia, or BPH). This condition is very common in older men. ?A prostate gland infection (prostatitis) or urinary tract infection. ?Certain medicines such as male hormones (like testosterone) or other medicines that raise testosterone levels. ?A rectal exam may be done as part of prostate cancer screening to help provide information about the size of your prostate gland. When a rectal exam is performed, it should be done after the PSA level is drawn to avoid any effect on the results. ?Depending on the PSA results, you may need more tests, such as: ?A physical exam to check the size of your prostate gland, if not done as part of screening. ?Blood and imaging tests. ?A procedure to remove tissue samples from your prostate gland for testing (biopsy). This is the only way to know for certain if you have prostate cancer. ?What are the benefits of prostate cancer screening? ?Screening can help to identify cancer at an early stage, before symptoms start and when the cancer can be treated more easily. ?There is a small chance that screening may lower your risk of dying from prostate cancer. The chance is small because prostate cancer is a slow-growing cancer, and most men with prostate cancer die from a different cause. ?What are the risks of prostate cancer screening? ?The main   risk of prostate cancer screening is diagnosing and treating prostate cancer that would never have caused any symptoms or problems. This is called overdiagnosisand overtreatment. PSA screening cannot tell you if your PSA is high due to cancer or a different cause. A prostate biopsy is the only procedure to diagnose prostate cancer. Even the results of a biopsy may not tell you if your cancer needs to be  treated. Slow-growing prostate cancer may not need any treatment other than monitoring, so diagnosing and treating it may cause unnecessary stress or other side effects. ?Questions to ask your health care provider ?When should I start prostate cancer screening? ?What is my risk for prostate cancer? ?How often do I need screening? ?What type of screening tests do I need? ?How do I get my test results? ?What do my results mean? ?Do I need treatment? ?Where to find more information ?The American Cancer Society: www.cancer.org ?American Urological Association: www.auanet.org ?Contact a health care provider if: ?You have difficulty urinating. ?You have pain when you urinate or ejaculate. ?You have blood in your urine or semen. ?You have pain in your back or in the area of your prostate. ?Summary ?Prostate cancer is a common type of cancer in men. The prostate gland is located below the bladder and in front of the rectum. This gland adds fluid to semen during ejaculation. ?Prostate cancer screening may identify cancer at an early stage, when the cancer can be treated more easily and is less likely to have spread to other areas of the body. ?The prostate-specific antigen (PSA) test is the recommended screening test for prostate cancer, but it has associated risks. ?Discuss the risks and benefits of prostate cancer screening with your health care provider. If you are age 70 or older, the risks that screening can cause are greater than the benefits that it may provide. ?This information is not intended to replace advice given to you by your health care provider. Make sure you discuss any questions you have with your health care provider. ?Document Revised: 02/19/2021 Document Reviewed: 02/19/2021 ?Elsevier Patient Education ? 2022 Elsevier Inc. ? ?

## 2021-09-07 NOTE — Progress Notes (Signed)
° °  09/07/2021 11:10 AM   Hanley Hays 08/04/1956 [id number]  Reason for visit: Follow up elevated PSA, urinary symptoms, new ED  HPI: 65 year old African-American male from [place] with history of DVT/PE on Xarelto and long history of elevated PSA and extensive work-up.  To briefly summarize, he underwent a negative prostate biopsy in 2017 for an elevated PSA of 5, underwent a 4K score with a 4% risk of finding a clinically significant prostate cancer on repeat biopsy, as well as a negative prostate MRI in 2018 that showed no suspicious lesions.  Prostate volume was 72 cc on prostate MRI.  PSA density is <0.15.  He has a history of isolated significantly elevated PSA values of 20-25 when the PSA was drawn within a few days of ejaculations, and these have all dropped down on repeat testing.  Urinalysis at our last visit was completely benign, and PVR was normal.  DRE has been benign  Most recent PSA on 08/30/2021 is stable at 9.5(20% free) from prior value of 8.9(20.7% free) in February 2022.  We again reviewed the AUA guidelines regarding PSA screening, as well as possible causes of false elevation including BPH, inflammation, recent sexual activity.  Overall has had a very reassuring work-up with low PSA density, negative biopsy, 4K score with only 4% chance of clinically significant prostate cancer, and negative prostate MRI.  He has some mild urinary symptoms of frequency, nocturia, but this is primarily related to his fluid intake.  If changes daily based on how much he drinks and what he drinks, and he is minimally bothered.   He also has had some trouble with obtaining and maintaining erections over the last year or so and is interested in trying medications.  We discussed the risks and benefits between Cialis and Viagra, and he opted for a trial of Cialis 5 mg on demand.  Behavioral strategies discussed regarding mild urinary symptoms Trial of Cialis 5 mg on demand for ED RTC 1 year  with PSA reflex to free prior All Sondra Come, MD  [facility] [address] [place], [place] [address] [phone]

## 2021-09-07 NOTE — Addendum Note (Signed)
Addended by: Frankey Shown on: 09/07/2021 11:53 AM   Modules accepted: Orders

## 2022-05-15 IMAGING — US US EXTREM LOW VENOUS*L*
1 series · 13 of 24 positions shown · non-contrast
Comparison: None.

CLINICAL DATA: Initial evaluation for acute pain and swelling.



[Series 1: us venous img lower uni left (dvt) · portal-venous · 13 of 40 slices shown]
[im 1/40]
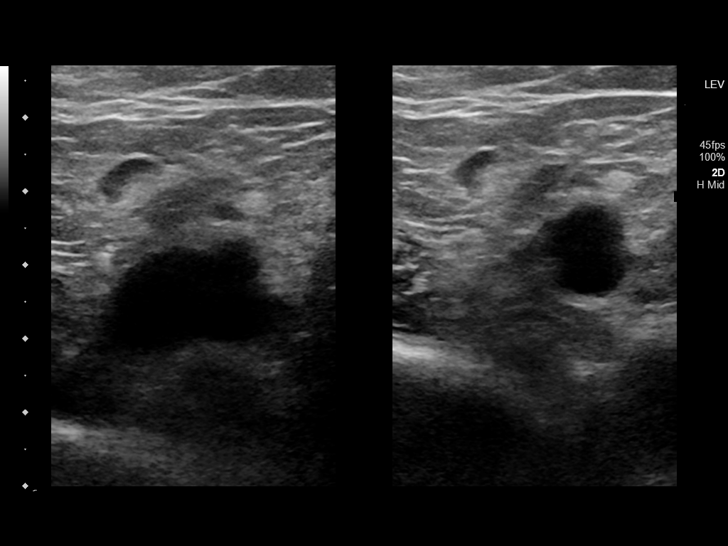
[im 4/40]
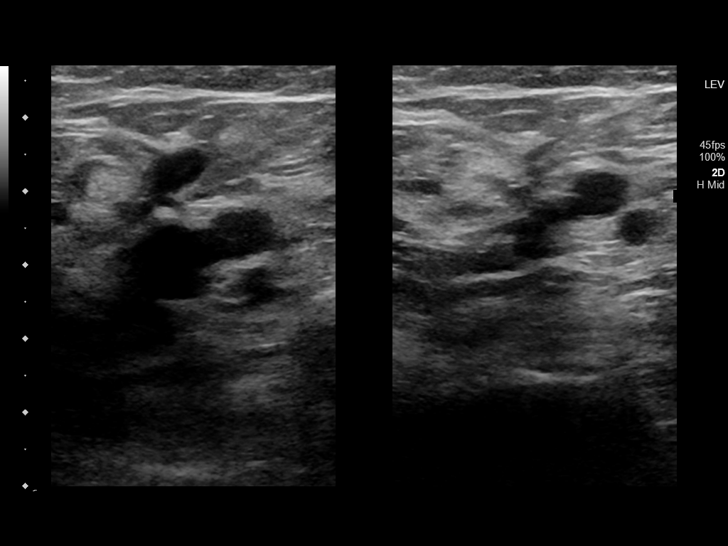
[im 7/40]
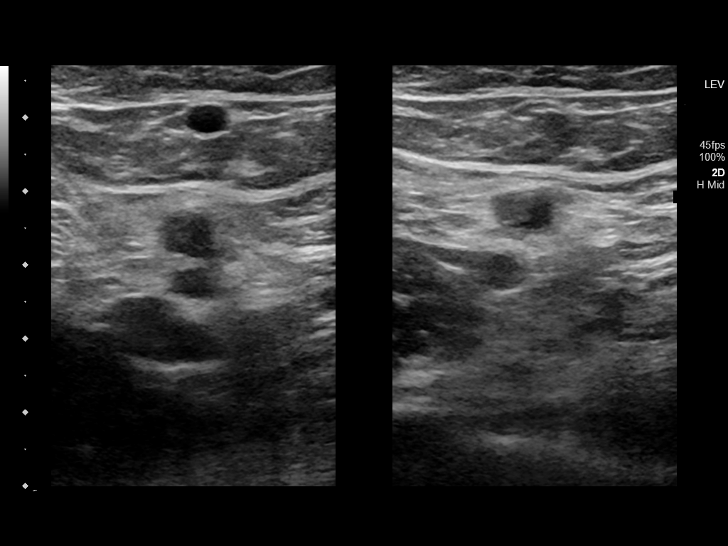
[im 11/40]
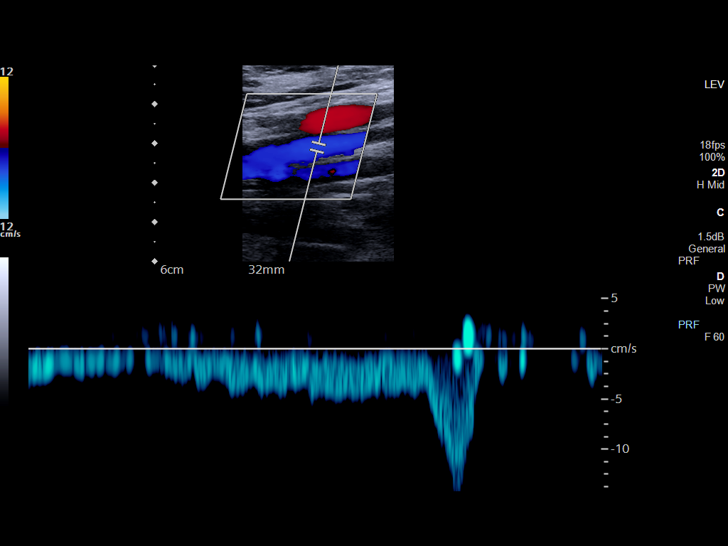
[im 14/40]
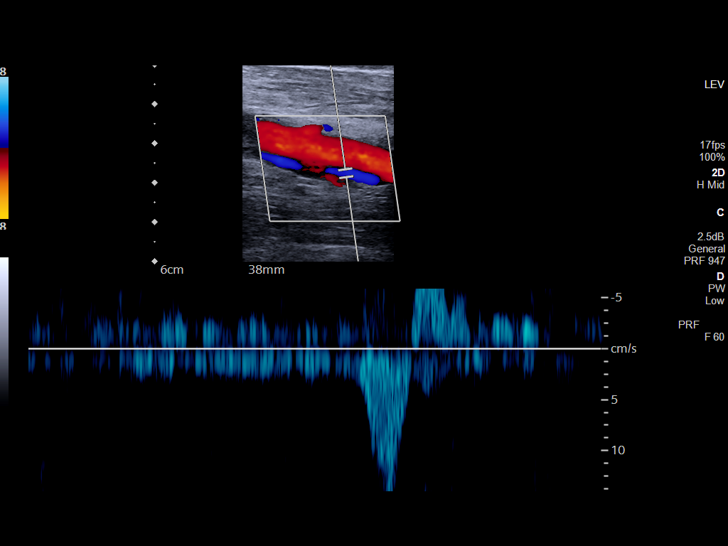
[im 17/40]
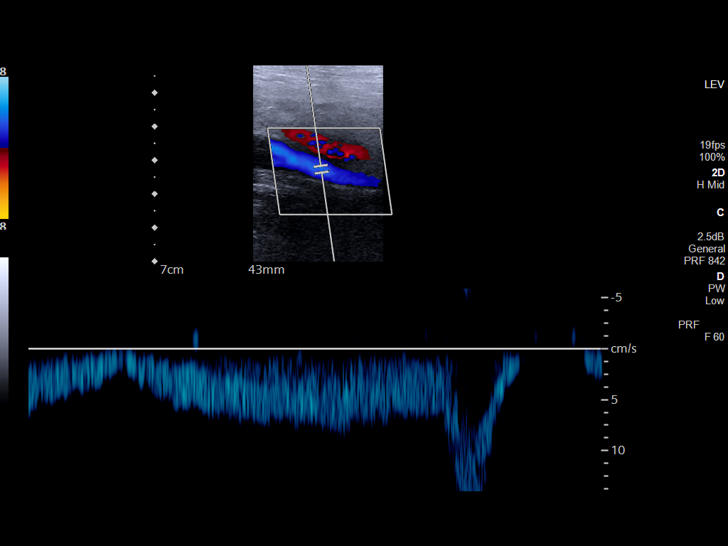
[im 21/40]
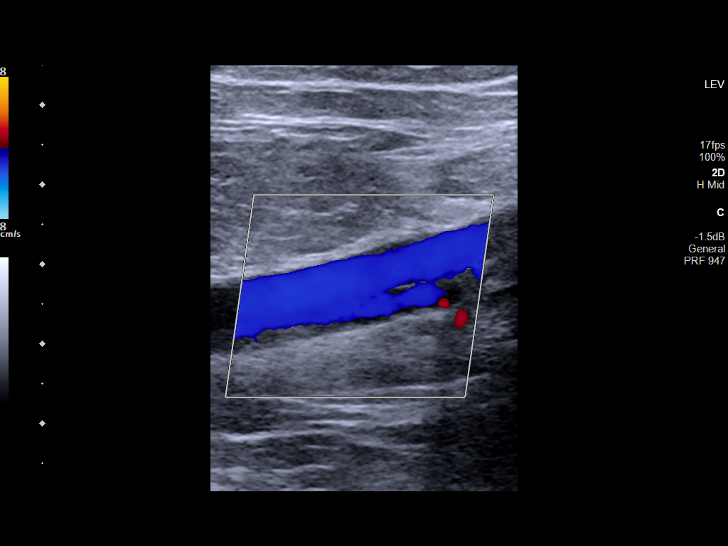
[im 23/40]
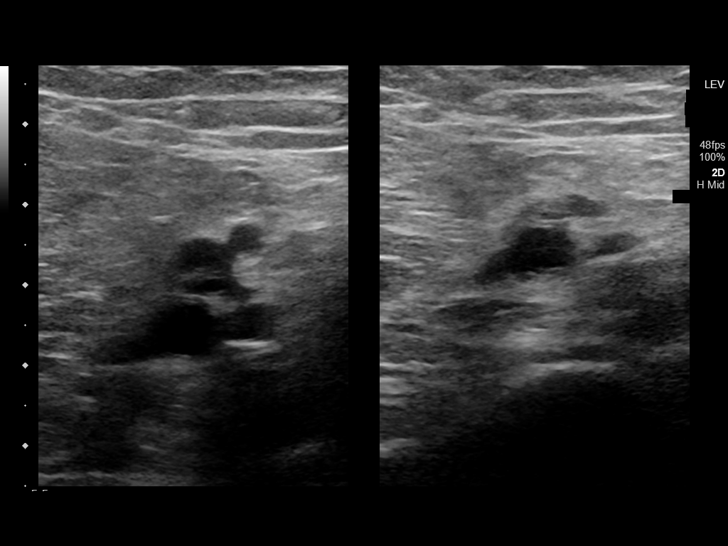
[im 26/40]
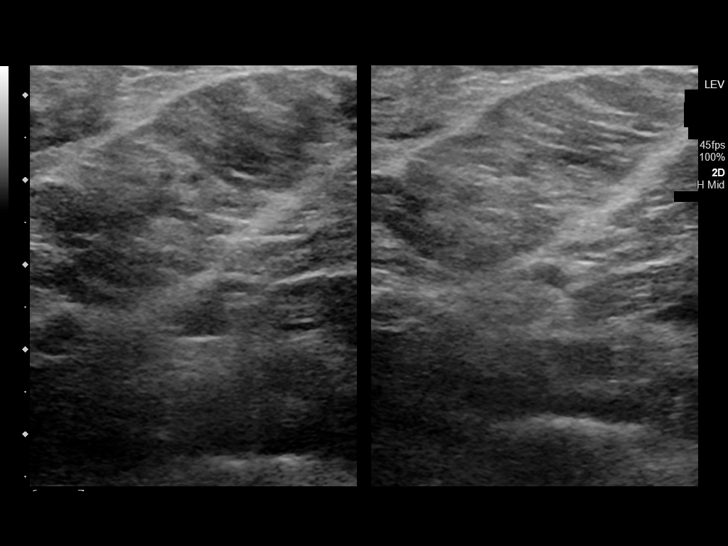
[im 29/40]
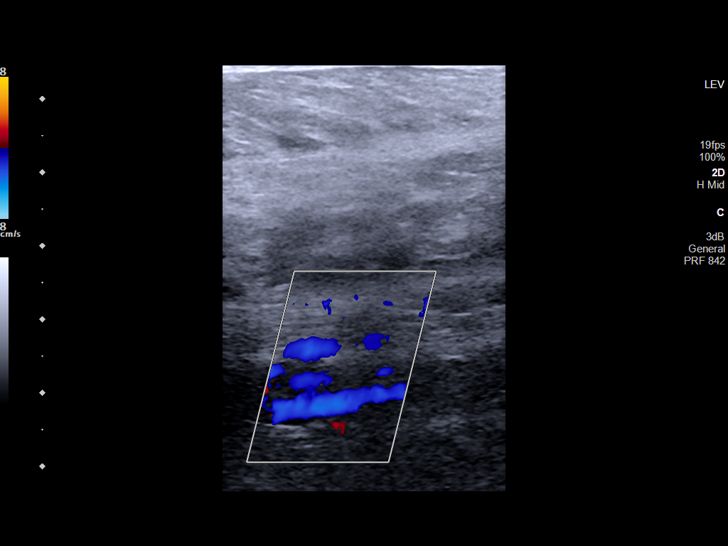
[im 33/40]
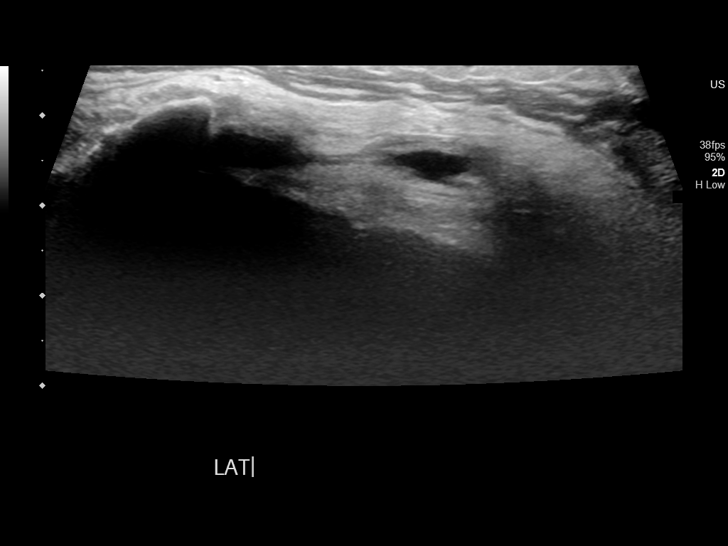
[im 36/40]
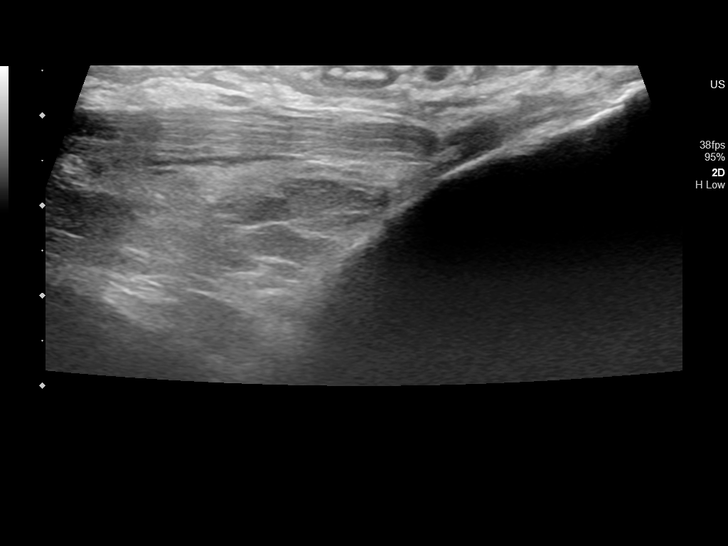
[im 40/40]
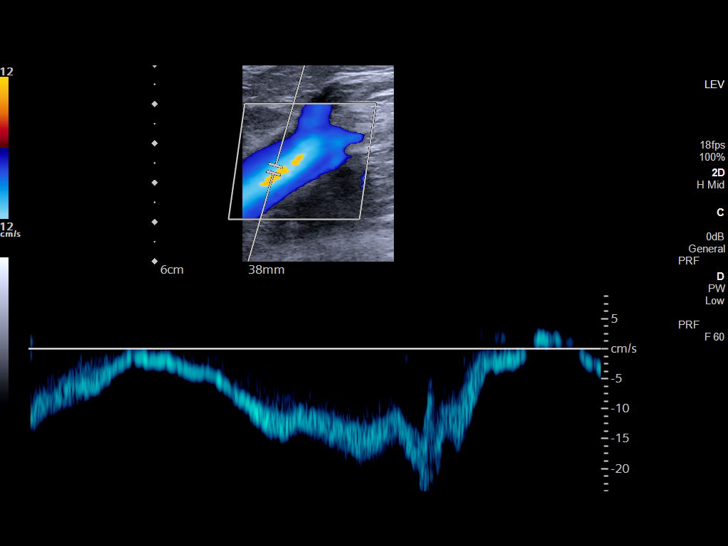

[13 of 24 positions shown; findings below may reference images not displayed]

FINDINGS: Contralateral Common Femoral Vein: Respiratory phasicity is normal
and symmetric with the symptomatic side. No evidence of thrombus.
Normal compressibility.

Common Femoral Vein: No evidence of thrombus. Normal
compressibility, respiratory phasicity and response to augmentation.

Saphenofemoral Junction: No evidence of thrombus. Normal
compressibility and flow on color Doppler imaging.

Profunda Femoral Vein: No evidence of thrombus. Normal
compressibility and flow on color Doppler imaging.

Femoral Vein: No evidence of thrombus. Normal compressibility,
respiratory phasicity and response to augmentation.

Popliteal Vein: No evidence of acute thrombus. Normal
compressibility. Linear echogenic density seen within the left
popliteal vein could reflect a fibrinous band, possibly related to
prior thrombus (image 21).

Calf Veins: No evidence of thrombus. Normal compressibility and flow
on color Doppler imaging.

Superficial Great Saphenous Vein: No evidence of thrombus. Normal
compressibility.

Venous Reflux:  None.

Other Findings: Trace fluid noted about the knee, suspected to
reflect a small joint effusion.
IMPRESSION: 1. No evidence of acute deep venous thrombosis.
2. Trace fluid about the left knee, suspected to reflect a small
joint effusion.

## 2022-06-12 ENCOUNTER — Encounter: Payer: Self-pay | Admitting: Ophthalmology

## 2022-06-14 NOTE — Discharge Instructions (Signed)

## 2022-06-18 ENCOUNTER — Ambulatory Visit: Payer: Medicare Other | Admitting: Anesthesiology

## 2022-06-18 ENCOUNTER — Ambulatory Visit
Admission: RE | Admit: 2022-06-18 | Discharge: 2022-06-18 | Disposition: A | Discharge status: Home / Self Care | Payer: Medicare Other | Source: Ambulatory Visit | Attending: Ophthalmology | Admitting: Ophthalmology

## 2022-06-18 ENCOUNTER — Encounter
Admission: RE | Disposition: A | Discharge status: Home / Self Care | Payer: Self-pay | Source: Ambulatory Visit | Attending: Ophthalmology

## 2022-06-18 ENCOUNTER — Other Ambulatory Visit: Payer: Self-pay

## 2022-06-18 ENCOUNTER — Encounter: Payer: Self-pay | Admitting: Ophthalmology

## 2022-06-18 DIAGNOSIS — Z86711 Personal history of pulmonary embolism: Secondary | ICD-10-CM | POA: Diagnosis not present

## 2022-06-18 DIAGNOSIS — H2512 Age-related nuclear cataract, left eye: Secondary | ICD-10-CM | POA: Diagnosis present

## 2022-06-18 DIAGNOSIS — I10 Essential (primary) hypertension: Secondary | ICD-10-CM | POA: Insufficient documentation

## 2022-06-18 DIAGNOSIS — Z86718 Personal history of other venous thrombosis and embolism: Secondary | ICD-10-CM | POA: Diagnosis not present

## 2022-06-18 DIAGNOSIS — Z87891 Personal history of nicotine dependence: Secondary | ICD-10-CM | POA: Diagnosis not present

## 2022-06-18 HISTORY — PX: CATARACT EXTRACTION W/PHACO: SHX586

## 2022-06-18 SURGERY — PHACOEMULSIFICATION, CATARACT, WITH IOL INSERTION
Anesthesia: Monitor Anesthesia Care | Site: Eye | Laterality: Left

## 2022-06-18 MED ORDER — LACTATED RINGERS IV SOLN: INTRAVENOUS | Status: DC

## 2022-06-18 MED ORDER — ARMC OPHTHALMIC DILATING DROPS
1.0000 | OPHTHALMIC | Status: DC | PRN
  Administered 2022-06-18 (×3): 1 via OPHTHALMIC

## 2022-06-18 MED ORDER — MOXIFLOXACIN HCL 0.5 % OP SOLN
OPHTHALMIC | Status: DC | PRN
  Administered 2022-06-18: 0.2 mL via OPHTHALMIC

## 2022-06-18 MED ORDER — MIDAZOLAM HCL 2 MG/2ML IJ SOLN
INTRAMUSCULAR | Status: DC | PRN
  Administered 2022-06-18: 1 mg via INTRAVENOUS

## 2022-06-18 MED ORDER — SIGHTPATH DOSE#1 BSS IO SOLN
INTRAOCULAR | Status: DC | PRN
  Administered 2022-06-18: 41 mL via OPHTHALMIC

## 2022-06-18 MED ORDER — BRIMONIDINE TARTRATE-TIMOLOL 0.2-0.5 % OP SOLN
OPHTHALMIC | Status: DC | PRN
  Administered 2022-06-18: 1 [drp] via OPHTHALMIC

## 2022-06-18 MED ORDER — FENTANYL CITRATE (PF) 100 MCG/2ML IJ SOLN
INTRAMUSCULAR | Status: DC | PRN
  Administered 2022-06-18: 50 ug via INTRAVENOUS

## 2022-06-18 MED ORDER — SIGHTPATH DOSE#1 BSS IO SOLN
INTRAOCULAR | Status: DC | PRN
  Administered 2022-06-18: 15 mL

## 2022-06-18 MED ORDER — TETRACAINE HCL 0.5 % OP SOLN
1.0000 [drp] | OPHTHALMIC | Status: DC | PRN
  Administered 2022-06-18 (×3): 1 [drp] via OPHTHALMIC

## 2022-06-18 MED ORDER — SIGHTPATH DOSE#1 NA CHONDROIT SULF-NA HYALURON 40-17 MG/ML IO SOLN
INTRAOCULAR | Status: DC | PRN
  Administered 2022-06-18: 1 mL via INTRAOCULAR

## 2022-06-18 MED ORDER — SIGHTPATH DOSE#1 BSS IO SOLN
INTRAOCULAR | Status: DC | PRN
  Administered 2022-06-18: 1 mL via INTRAMUSCULAR

## 2022-06-18 SURGICAL SUPPLY — 12 items
ACTIVE FMS W/ INTREPID ULTRA SLEEVES IMPLANT
CATARACT SUITE SIGHTPATH (MISCELLANEOUS) ×1 IMPLANT
FEE CATARACT SUITE SIGHTPATH (MISCELLANEOUS) ×1 IMPLANT
GLOVE SURG ENC TEXT LTX SZ8 (GLOVE) ×1 IMPLANT
GLOVE SURG TRIUMPH 8.0 PF LTX (GLOVE) ×1 IMPLANT
LENS IOL EYHANCE TORIC II 18.0 ×1 IMPLANT
LENS IOL EYHANCE TRC 225 18.0 IMPLANT
LENS IOL EYHNC TORIC 225 18.0 ×1 IMPLANT
NDL FILTER BLUNT 18X1 1/2 (NEEDLE) ×1 IMPLANT
NEEDLE FILTER BLUNT 18X1 1/2 (NEEDLE) ×1 IMPLANT
SYR 3ML LL SCALE MARK (SYRINGE) ×1 IMPLANT
WATER STERILE IRR 250ML POUR (IV SOLUTION) ×1 IMPLANT

## 2022-06-18 NOTE — Op Note (Signed)
PREOPERATIVE DIAGNOSIS:  Nuclear sclerotic cataract of the left eye.   POSTOPERATIVE DIAGNOSIS:  Nuclear sclerotic cataract of the left eye.   OPERATIVE PROCEDURE: Procedure(s): CATARACT EXTRACTION PHACO AND INTRAOCULAR LENS PLACEMENT (IOC) LEFT eyhance toric   SURGEON:  Birder Robson, MD.   ANESTHESIA: 1.      Managed anesthesia care. 2.     0.57ml os Shugarcaine was instilled following the paracentesis [email or web address]   COMPLICATIONS:  None.   TECHNIQUE:   Stop and chop    DESCRIPTION OF PROCEDURE:  The patient was examined and consented in the preoperative holding area where the aforementioned topical anesthesia was applied to the left eye.  The patient was brought back to the Operating Room where he was sat upright on the gurney and given a target to fixate upon while the eye was marked at the 3:00 and 9:00 position.  The patient was then reclined on the operating table.  The eye was prepped and draped in the usual sterile ophthalmic fashion and a lid speculum was placed. A paracentesis was created with the side port blade and the anterior chamber was filled with viscoelastic. A near clear corneal incision was performed with the steel keratome. A continuous curvilinear capsulorrhexis was performed with a cystotome followed by the capsulorrhexis forceps. Hydrodissection and hydrodelineation were carried out with BSS on a blunt cannula. The lens was removed in a stop and chop technique and the remaining cortical material was removed with the irrigation-aspiration handpiece. The eye was inflated with viscoelastic and the ZCT lens was placed in the eye and rotated to within a few degrees of the predetermined orientation.  The remaining viscoelastic was removed from the eye.  The Sinskey hook was used to rotate the toric lens into its final resting place at 078 degrees.  0.1 ml of Vigamox was placed in the anterior chamber. The eye was inflated to a physiologic pressure and found to be watertight.   The eye was dressed with Vigamox and [place]  The patient was given protective glasses to wear throughout the day and a shield with which to sleep tonight. The patient was also given drops with which to begin a drop regimen today and will follow-up with me in one day. Implant Name Type Inv. Item Serial No. Manufacturer Lot No. LRB No. Used Action  Eyhance Toric II IOL Ophthalmic Related  [id number] [employer]  Left 1 Implanted   Procedure(s) with comments: CATARACT EXTRACTION PHACO AND INTRAOCULAR LENS PLACEMENT (IOC) LEFT eyhance toric (Left) - 4.32 00:26.2  Electronically signed: Birder Robson 10/10/202312:36 PM

## 2022-06-18 NOTE — Anesthesia Preprocedure Evaluation (Signed)
Anesthesia Evaluation  Patient identified by MRN, date of birth, ID band Patient awake    Reviewed: Allergy & Precautions, NPO status , Patient's Chart, lab work & pertinent test results  History of Anesthesia Complications Negative for: history of anesthetic complications  Airway Mallampati: III  TM Distance: >3 FB Neck ROM: full    Dental  (+) Chipped   Pulmonary neg shortness of breath, former smoker,    Pulmonary exam normal        Cardiovascular Exercise Tolerance: Good hypertension, (-) anginaNormal cardiovascular exam     Neuro/Psych negative neurological ROS  negative psych ROS   GI/Hepatic negative GI ROS, Neg liver ROS, neg GERD  ,  Endo/Other  negative endocrine ROS  Renal/GU      Musculoskeletal   Abdominal   Peds  Hematology negative hematology ROS (+)   Anesthesia Other Findings Past Medical History: No date: 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) No date: Gout No date: Heart murmur No date: Hypertension No date: PE (pulmonary thromboembolism) (HCC)  Past Surgical History: 2013: THYROIDECTOMY  BMI    Body Mass Index: 27.48 kg/m      Reproductive/Obstetrics negative OB ROS                             Anesthesia Physical Anesthesia Plan  ASA: 3  Anesthesia Plan: MAC   Post-op Pain Management:    Induction: Intravenous  PONV Risk Score and Plan:   Airway Management Planned: Natural Airway and Nasal Cannula  Additional Equipment:   Intra-op Plan:   Post-operative Plan:   Informed Consent: I have reviewed the patients History and Physical, chart, labs and discussed the procedure including the risks, benefits and alternatives for the proposed anesthesia with the patient or authorized representative who has indicated his/her understanding and acceptance.     Dental Advisory Given  Plan Discussed with: Anesthesiologist, CRNA and  Surgeon  Anesthesia Plan Comments: (Patient consented for risks of anesthesia including but not limited to:  - adverse reactions to medications - damage to eyes, teeth, lips or other oral mucosa - nerve damage due to positioning  - sore throat or hoarseness - Damage to heart, brain, nerves, lungs, other parts of body or loss of life  Patient voiced understanding.)        Anesthesia Quick Evaluation

## 2022-06-18 NOTE — H&P (Signed)
[facility]   Primary Care Physician:  Sherron Monday, MD Ophthalmologist: Dr. Willey Blade  Pre-Procedure History & Physical: HPI:  Fernando Cole is a 66 y.o. male here for cataract surgery.   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)    Gout    Heart murmur    Hypertension    PE (pulmonary thromboembolism) (HCC)     Past Surgical History:  Procedure Laterality Date   THYROIDECTOMY  2013    Prior to Admission medications   Medication Sig Start Date End Date Taking? Authorizing Provider  amLODipine-benazepril (LOTREL) 5-40 MG capsule Take 1 capsule by mouth daily.   Yes [provider]  folic acid (FOLVITE) 800 MCG tablet Take 400 mcg by mouth daily.   Yes [provider]  hydrALAZINE (APRESOLINE) 25 MG tablet Take 25 mg by mouth in the morning and at bedtime.   Yes [provider]  levothyroxine (SYNTHROID, LEVOTHROID) 100 MCG tablet Take 100 mcg by mouth daily before breakfast.   Yes [provider]  magnesium oxide (MAG-OX) 400 (240 Mg) MG tablet Take 400 mg by mouth daily.   Yes [provider]  rivaroxaban (XARELTO) 10 MG TABS tablet Take 10 mg by mouth daily.   Yes [provider]  rosuvastatin (CRESTOR) 5 MG tablet Take 5 mg by mouth at bedtime. 05/09/21  Yes [provider]  tamsulosin (FLOMAX) 0.4 MG CAPS capsule Take 0.4 mg by mouth.   Yes [provider]  VITAMIN D PO Take 2,000 Units by mouth daily.   Yes [provider]  zinc gluconate 50 MG tablet Take 50 mg by mouth daily.   Yes [provider]  diclofenac Sodium (VOLTAREN) 1 % GEL Apply 2 g topically 4 (four) times daily. 09/18/20   Nita Sickle, MD  tadalafil (CIALIS) 5 MG tablet Take 1 tablet (5 mg total) by mouth daily as needed for erectile dysfunction. Patient not taking: Reported on 06/12/2022 09/07/21   Sondra Come, MD    Allergies as of 05/10/2022   (No  Known Allergies)    Family History  Problem Relation Age of Onset   Prolactinoma Neg Hx    Prostate cancer Neg Hx    Kidney cancer Neg Hx     Social History   Socioeconomic History   Marital status: Married    Spouse name: Not on file   Number of children: Not on file   Years of education: Not on file   Highest education level: Not on file  Occupational History   Not on file  Tobacco Use   Smoking status: Former    Types: Cigarettes    Quit date: 2013    Years since quitting: 10.7   Smokeless tobacco: Never  Vaping Use   Vaping Use: Never used  Substance and Sexual Activity   Alcohol use: Never   Drug use: Never   Sexual activity: Not on file  Other Topics Concern   Not on file  Social History Narrative   Not on file   Social Determinants of Health   Financial Resource Strain: Not on file  Food Insecurity: Not on file  Transportation Needs: Not on file  Physical Activity: Not on file  Stress: Not on file  Social Connections: Not on file  Intimate Partner Violence: Not on file    Review of Systems: See HPI, otherwise negative ROS  Physical Exam: BP 120/83   Pulse 72  Temp 98.1 F (36.7 C) (Temporal)   Ht 6\' 2"  (1.88 m)   Wt 95.7 kg   SpO2 96%   BMI 27.08 kg/m  General:   Alert, cooperative in NAD Head:  Normocephalic and atraumatic. Respiratory:  Normal work of breathing. Cardiovascular:  RRR  Impression/Plan: Fernando Cole is here for cataract surgery.  Risks, benefits, limitations, and alternatives regarding cataract surgery have been reviewed with the patient.  Questions have been answered.  All parties agreeable.   Birder Robson, MD  06/18/2022, 11:56 AM

## 2022-06-18 NOTE — Transfer of Care (Signed)
Immediate Anesthesia Transfer of Care Note  Patient: Fernando Cole  Procedure(s) Performed: CATARACT EXTRACTION PHACO AND INTRAOCULAR LENS PLACEMENT (IOC) LEFT eyhance toric (Left: Eye)  Patient Location: PACU  Anesthesia Type: MAC  Level of Consciousness: awake, alert  and patient cooperative  Airway and Oxygen Therapy: Patient Spontanous Breathing and Patient connected to supplemental oxygen  Post-op Assessment: Post-op Vital signs reviewed, Patient's Cardiovascular Status Stable, Respiratory Function Stable, Patent Airway and No signs of Nausea or vomiting  Post-op Vital Signs: Reviewed and stable  Complications: No notable events documented.

## 2022-06-18 NOTE — Anesthesia Postprocedure Evaluation (Signed)
Anesthesia Post Note  Patient: Public librarian  Procedure(s) Performed: CATARACT EXTRACTION PHACO AND INTRAOCULAR LENS PLACEMENT (IOC) LEFT eyhance toric (Left: Eye)  Patient location during evaluation: Phase II Anesthesia Type: MAC Level of consciousness: awake and alert Pain management: pain level controlled Vital Signs Assessment: post-procedure vital signs reviewed and stable Respiratory status: spontaneous breathing, nonlabored ventilation, respiratory function stable and patient connected to nasal cannula oxygen Cardiovascular status: stable and blood pressure returned to baseline Postop Assessment: no apparent nausea or vomiting Anesthetic complications: no   No notable events documented.   Last Vitals:  Vitals:   06/18/22 1236 06/18/22 1240  BP: 124/82 134/86  Pulse: 70 66  Resp: 14 14  Temp: 36.4 C 36.4 C  SpO2: 99% 98%    Last Pain:  Vitals:   06/18/22 1236  TempSrc:   PainSc: 0-No pain                 Precious Haws Chan Sheahan

## 2022-06-19 ENCOUNTER — Encounter: Payer: Self-pay | Admitting: Ophthalmology

## 2022-06-26 ENCOUNTER — Encounter: Payer: Self-pay | Admitting: Ophthalmology

## 2022-07-01 NOTE — Discharge Instructions (Signed)

## 2022-07-02 ENCOUNTER — Encounter: Payer: Self-pay | Admitting: Ophthalmology

## 2022-07-02 ENCOUNTER — Encounter
Admission: RE | Disposition: A | Discharge status: Home / Self Care | Payer: Self-pay | Source: Ambulatory Visit | Attending: Ophthalmology

## 2022-07-02 ENCOUNTER — Ambulatory Visit
Admission: RE | Admit: 2022-07-02 | Discharge: 2022-07-02 | Disposition: A | Discharge status: Home / Self Care | Payer: Medicare Other | Source: Ambulatory Visit | Attending: Ophthalmology | Admitting: Ophthalmology

## 2022-07-02 ENCOUNTER — Ambulatory Visit: Payer: Medicare Other | Admitting: Anesthesiology

## 2022-07-02 DIAGNOSIS — Z87891 Personal history of nicotine dependence: Secondary | ICD-10-CM | POA: Diagnosis not present

## 2022-07-02 DIAGNOSIS — I10 Essential (primary) hypertension: Secondary | ICD-10-CM | POA: Diagnosis not present

## 2022-07-02 DIAGNOSIS — Z86718 Personal history of other venous thrombosis and embolism: Secondary | ICD-10-CM | POA: Diagnosis not present

## 2022-07-02 DIAGNOSIS — Z86711 Personal history of pulmonary embolism: Secondary | ICD-10-CM | POA: Diagnosis not present

## 2022-07-02 DIAGNOSIS — H2511 Age-related nuclear cataract, right eye: Secondary | ICD-10-CM | POA: Diagnosis present

## 2022-07-02 HISTORY — PX: CATARACT EXTRACTION W/PHACO: SHX586

## 2022-07-02 SURGERY — PHACOEMULSIFICATION, CATARACT, WITH IOL INSERTION
Anesthesia: Monitor Anesthesia Care | Site: Eye | Laterality: Right

## 2022-07-02 MED ORDER — SIGHTPATH DOSE#1 NA CHONDROIT SULF-NA HYALURON 40-17 MG/ML IO SOLN
INTRAOCULAR | Status: DC | PRN
  Administered 2022-07-02: 1 mL via INTRAOCULAR

## 2022-07-02 MED ORDER — BRIMONIDINE TARTRATE-TIMOLOL 0.2-0.5 % OP SOLN
OPHTHALMIC | Status: DC | PRN
  Administered 2022-07-02: 1 [drp] via OPHTHALMIC

## 2022-07-02 MED ORDER — MIDAZOLAM HCL 2 MG/2ML IJ SOLN
INTRAMUSCULAR | Status: DC | PRN
  Administered 2022-07-02: 1 mg via INTRAVENOUS

## 2022-07-02 MED ORDER — FENTANYL CITRATE (PF) 100 MCG/2ML IJ SOLN
INTRAMUSCULAR | Status: DC | PRN
  Administered 2022-07-02: 50 ug via INTRAVENOUS

## 2022-07-02 MED ORDER — TETRACAINE HCL 0.5 % OP SOLN
1.0000 [drp] | OPHTHALMIC | Status: DC | PRN
  Administered 2022-07-02 (×3): 1 [drp] via OPHTHALMIC

## 2022-07-02 MED ORDER — SIGHTPATH DOSE#1 BSS IO SOLN
INTRAOCULAR | Status: DC | PRN
  Administered 2022-07-02: 56 mL via OPHTHALMIC

## 2022-07-02 MED ORDER — LACTATED RINGERS IV SOLN: INTRAVENOUS | Status: DC

## 2022-07-02 MED ORDER — MOXIFLOXACIN HCL 0.5 % OP SOLN
OPHTHALMIC | Status: DC | PRN
  Administered 2022-07-02: 0.2 mL via OPHTHALMIC

## 2022-07-02 MED ORDER — ARMC OPHTHALMIC DILATING DROPS
1.0000 | OPHTHALMIC | Status: DC | PRN
  Administered 2022-07-02 (×3): 1 via OPHTHALMIC

## 2022-07-02 MED ORDER — SIGHTPATH DOSE#1 BSS IO SOLN
INTRAOCULAR | Status: DC | PRN
  Administered 2022-07-02: 1 mL via INTRAMUSCULAR

## 2022-07-02 MED ORDER — SIGHTPATH DOSE#1 BSS IO SOLN
INTRAOCULAR | Status: DC | PRN
  Administered 2022-07-02: 15 mL

## 2022-07-02 SURGICAL SUPPLY — 11 items
CATARACT SUITE SIGHTPATH (MISCELLANEOUS) ×1 IMPLANT
FEE CATARACT SUITE SIGHTPATH (MISCELLANEOUS) ×1 IMPLANT
GLOVE SURG ENC TEXT LTX SZ8 (GLOVE) ×1 IMPLANT
GLOVE SURG TRIUMPH 8.0 PF LTX (GLOVE) ×1 IMPLANT
LENS IOL EYHANCE TORIC II 17 ×1 IMPLANT
LENS IOL EYHANCE TRC 225 17.0 IMPLANT
LENS TECNIS EYHANCE TORIC II ×1 IMPLANT
NDL FILTER BLUNT 18X1 1/2 (NEEDLE) ×1 IMPLANT
NEEDLE FILTER BLUNT 18X1 1/2 (NEEDLE) ×1 IMPLANT
SYR 3ML LL SCALE MARK (SYRINGE) ×1 IMPLANT
WATER STERILE IRR 250ML POUR (IV SOLUTION) ×1 IMPLANT

## 2022-07-02 NOTE — Transfer of Care (Signed)
Immediate Anesthesia Transfer of Care Note  Patient: Fernando Cole  Procedure(s) Performed: CATARACT EXTRACTION PHACO AND INTRAOCULAR LENS PLACEMENT (IOC) RIGHT Eyhance Toric 3.41 00:28.0 (Right: Eye)  Patient Location: PACU  Anesthesia Type: MAC  Level of Consciousness: awake, alert  and patient cooperative  Airway and Oxygen Therapy: Patient Spontanous Breathing and Patient connected to supplemental oxygen  Post-op Assessment: Post-op Vital signs reviewed, Patient's Cardiovascular Status Stable, Respiratory Function Stable, Patent Airway and No signs of Nausea or vomiting  Post-op Vital Signs: Reviewed and stable  Complications: No notable events documented.

## 2022-07-02 NOTE — Op Note (Signed)
PREOPERATIVE DIAGNOSIS:  Nuclear sclerotic cataract of the right eye.   POSTOPERATIVE DIAGNOSIS:  Nuclear sclerotic cataract of the right eye.   OPERATIVE PROCEDURE: Procedure(s): CATARACT EXTRACTION PHACO AND INTRAOCULAR LENS PLACEMENT (IOC) RIGHT Eyhance Toric 3.41 00:28.0   SURGEON:  Birder Robson, MD.   ANESTHESIA: 1.      Managed anesthesia care. 2.     0.75ml of Shugarcaine was instilled following the paracentesis  Anesthesiologist: Dimas Millin, MD CRNA: Jacqualin Combes, CRNA  COMPLICATIONS:  None.   TECHNIQUE:   Stop and chop    DESCRIPTION OF PROCEDURE:  The patient was examined and consented in the preoperative holding area where the aforementioned topical anesthesia was applied to the right eye.  The patient was brought back to the Operating Room where he was sat upright on the gurney and given a target to fixate upon while the eye was marked at the 3:00 and 9:00 position.  The patient was then reclined on the operating table.  The eye was prepped and draped in the usual sterile ophthalmic fashion and a lid speculum was placed. A paracentesis was created with the side port blade and the anterior chamber was filled with viscoelastic. A near clear corneal incision was performed with the steel keratome. A continuous curvilinear capsulorrhexis was performed with a cystotome followed by the capsulorrhexis forceps. Hydrodissection and hydrodelineation were carried out with BSS on a blunt cannula. The lens was removed in a stop and chop technique and the remaining cortical material was removed with the irrigation-aspiration handpiece. The eye was inflated with viscoelastic and the ZCT  lens  was placed in the eye and rotated to within a few degrees of the predetermined orientation.  The remaining viscoelastic was removed from the eye.  The Sinskey hook was used to rotate the toric lens into its final resting place at 105 degrees.  0. The eye was inflated to a physiologic pressure and  found to be watertight. 0.63ml of Vigamox was placed in the anterior chamber.  The eye was dressed with Vigamox and [place]. The patient was given protective glasses to wear throughout the day and a shield with which to sleep tonight. The patient was also given drops with which to begin a drop regimen today and will follow-up with me in one day. Implant Name Type Inv. Item Serial No. Manufacturer Lot No. LRB No. Used Action  TECNIS EYHANCE TORIC II IOL Intraocular Lens  [id number] Wynetta Emery [employer]  Right 1 Implanted   Procedure(s): CATARACT EXTRACTION PHACO AND INTRAOCULAR LENS PLACEMENT (IOC) RIGHT Eyhance Toric 3.41 00:28.0 (Right)  Electronically signed: Birder Robson 07/02/2022 11:27 AM

## 2022-07-02 NOTE — H&P (Signed)
[facility]   Primary Care Physician:  Sherron Monday, MD Ophthalmologist: Dr. Druscilla Brownie  Pre-Procedure History & Physical: HPI:  Fernando Cole is a 66 y.o. male here for cataract surgery.   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)    Gout    Heart murmur    Hypertension    PE (pulmonary thromboembolism) (HCC)     Past Surgical History:  Procedure Laterality Date   CATARACT EXTRACTION W/PHACO Left 06/18/2022   Procedure: CATARACT EXTRACTION PHACO AND INTRAOCULAR LENS PLACEMENT (IOC) LEFT eyhance toric;  Surgeon: Galen Manila, MD;  Location: [facility];  Service: Ophthalmology;  Laterality: Left;  4.32 00:26.2   THYROIDECTOMY  2013    Prior to Admission medications   Medication Sig Start Date End Date Taking? Authorizing Provider  amLODipine-benazepril (LOTREL) 5-40 MG capsule Take 1 capsule by mouth daily.   Yes [provider]  diclofenac Sodium (VOLTAREN) 1 % GEL Apply 2 g topically 4 (four) times daily. 09/18/20  Yes Veronese, [place], MD  folic acid (FOLVITE) 800 MCG tablet Take 400 mcg by mouth daily.   Yes [provider]  hydrALAZINE (APRESOLINE) 25 MG tablet Take 25 mg by mouth in the morning and at bedtime.   Yes [provider]  levothyroxine (SYNTHROID, LEVOTHROID) 100 MCG tablet Take 100 mcg by mouth daily before breakfast.   Yes [provider]  magnesium oxide (MAG-OX) 400 (240 Mg) MG tablet Take 400 mg by mouth daily.   Yes [provider]  rivaroxaban (XARELTO) 10 MG TABS tablet Take 10 mg by mouth daily.   Yes [provider]  rosuvastatin (CRESTOR) 5 MG tablet Take 5 mg by mouth at bedtime. 05/09/21  Yes [provider]  tadalafil (CIALIS) 5 MG tablet Take 1 tablet (5 mg total) by mouth daily as needed for erectile dysfunction. 09/07/21  Yes Sondra Come, MD  tamsulosin (FLOMAX) 0.4 MG CAPS capsule Take 0.4 mg by mouth.   Yes  [provider]  VITAMIN D PO Take 2,000 Units by mouth daily.   Yes [provider]  zinc gluconate 50 MG tablet Take 50 mg by mouth daily.   Yes [provider]    Allergies as of 05/10/2022   (No Known Allergies)    Family History  Problem Relation Age of Onset   Prolactinoma Neg Hx    Prostate cancer Neg Hx    Kidney cancer Neg Hx     Social History   Socioeconomic History   Marital status: Married    Spouse name: Not on file   Number of children: Not on file   Years of education: Not on file   Highest education level: Not on file  Occupational History   Not on file  Tobacco Use   Smoking status: Former    Types: Cigarettes    Quit date: 2013    Years since quitting: 10.8   Smokeless tobacco: Never  Vaping Use   Vaping Use: Never used  Substance and Sexual Activity   Alcohol use: Never   Drug use: Never   Sexual activity: Not on file  Other Topics Concern   Not on file  Social History Narrative   Not on file   Social Determinants of Health   Financial Resource Strain: Not on file  Food Insecurity: Not on file  Transportation Needs: Not on file  Physical Activity: Not on file  Stress: Not on file  Social Connections: Not on file  Intimate Partner Violence: Not on file    Review of Systems: See HPI, otherwise negative ROS  Physical Exam: BP (!) 147/90   Pulse 71   Temp 98.2 F (36.8 C) (Temporal)   Resp 16   Wt 99.3 kg   SpO2 96%   BMI 28.12 kg/m  General:   Alert, cooperative in NAD Head:  Normocephalic and atraumatic. Respiratory:  Normal work of breathing. Cardiovascular:  RRR  Impression/Plan: Fernando Cole is here for cataract surgery.  Risks, benefits, limitations, and alternatives regarding cataract surgery have been reviewed with the patient.  Questions have been answered.  All parties agreeable.   Birder Robson, MD  07/02/2022, 11:02 AM

## 2022-07-02 NOTE — Anesthesia Postprocedure Evaluation (Signed)
Anesthesia Post Note  Patient: Public librarian  Procedure(s) Performed: CATARACT EXTRACTION PHACO AND INTRAOCULAR LENS PLACEMENT (IOC) RIGHT Eyhance Toric 3.41 00:28.0 (Right: Eye)  Patient location during evaluation: PACU Anesthesia Type: MAC Level of consciousness: awake and alert Pain management: pain level controlled Vital Signs Assessment: post-procedure vital signs reviewed and stable Respiratory status: spontaneous breathing, nonlabored ventilation, respiratory function stable and patient connected to nasal cannula oxygen Cardiovascular status: stable and blood pressure returned to baseline Postop Assessment: no apparent nausea or vomiting Anesthetic complications: no   No notable events documented.   Last Vitals:  Vitals:   07/02/22 1011 07/02/22 1129  BP: (!) 147/90 (!) 141/89  Pulse: 71 60  Resp: 16 18  Temp: 36.8 C (!) 36.4 C  SpO2: 96% 98%    Last Pain:  Vitals:   07/02/22 1135  TempSrc:   PainSc: 0-No pain                 Dimas Millin

## 2022-07-02 NOTE — Anesthesia Preprocedure Evaluation (Signed)
Anesthesia Evaluation  Patient identified by MRN, date of birth, ID band Patient awake    Reviewed: Allergy & Precautions, NPO status , Patient's Chart, lab work & pertinent test results  History of Anesthesia Complications Negative for: history of anesthetic complications  Airway Mallampati: III  TM Distance: >3 FB Neck ROM: full    Dental  (+) Chipped   Pulmonary neg shortness of breath, former smoker,    Pulmonary exam normal        Cardiovascular Exercise Tolerance: Good hypertension, (-) anginaNormal cardiovascular exam     Neuro/Psych negative neurological ROS  negative psych ROS   GI/Hepatic negative GI ROS, Neg liver ROS, neg GERD  ,  Endo/Other  negative endocrine ROS  Renal/GU      Musculoskeletal   Abdominal   Peds  Hematology negative hematology ROS (+)   Anesthesia Other Findings Past Medical History: No date: 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) No date: Gout No date: Heart murmur No date: Hypertension No date: PE (pulmonary thromboembolism) (HCC)  Past Surgical History: 2013: THYROIDECTOMY  BMI    Body Mass Index: 27.48 kg/m      Reproductive/Obstetrics negative OB ROS                             Anesthesia Physical  Anesthesia Plan  ASA: 3  Anesthesia Plan: MAC   Post-op Pain Management:    Induction: Intravenous  PONV Risk Score and Plan:   Airway Management Planned: Natural Airway and Nasal Cannula  Additional Equipment:   Intra-op Plan:   Post-operative Plan:   Informed Consent: I have reviewed the patients History and Physical, chart, labs and discussed the procedure including the risks, benefits and alternatives for the proposed anesthesia with the patient or authorized representative who has indicated his/her understanding and acceptance.     Dental Advisory Given  Plan Discussed with: Anesthesiologist, CRNA and  Surgeon  Anesthesia Plan Comments: (Patient consented for risks of anesthesia including but not limited to:  - adverse reactions to medications - damage to eyes, teeth, lips or other oral mucosa - nerve damage due to positioning  - sore throat or hoarseness - Damage to heart, brain, nerves, lungs, other parts of body or loss of life  Patient voiced understanding.)        Anesthesia Quick Evaluation

## 2022-07-09 ENCOUNTER — Encounter: Payer: Self-pay | Admitting: Ophthalmology

## 2022-08-27 ENCOUNTER — Other Ambulatory Visit: Payer: Medicare Other

## 2022-08-27 DIAGNOSIS — R972 Elevated prostate specific antigen [PSA]: Secondary | ICD-10-CM

## 2022-08-28 LAB — PSA TOTAL (REFLEX TO FREE): Prostate Specific Ag, Serum: 11.4 ng/mL — ABNORMAL HIGH (ref 0.0–4.0)

## 2022-09-05 ENCOUNTER — Ambulatory Visit (INDEPENDENT_AMBULATORY_CARE_PROVIDER_SITE_OTHER): Payer: Medicare Other | Admitting: Urology

## 2022-09-05 ENCOUNTER — Encounter: Payer: Self-pay | Admitting: Urology

## 2022-09-05 VITALS — BP 135/88 | HR 85 | Ht 74.0 in | Wt 216.7 lb

## 2022-09-05 DIAGNOSIS — N3281 Overactive bladder: Secondary | ICD-10-CM

## 2022-09-05 DIAGNOSIS — N529 Male erectile dysfunction, unspecified: Secondary | ICD-10-CM

## 2022-09-05 DIAGNOSIS — R972 Elevated prostate specific antigen [PSA]: Secondary | ICD-10-CM

## 2022-09-05 MED ORDER — TADALAFIL 5 MG PO TABS: 5.0000 mg | ORAL_TABLET | Freq: Every day | ORAL | 8 refills | Status: DC | PRN

## 2022-09-05 NOTE — Progress Notes (Signed)
   09/05/2022 2:18 PM   Fernando Cole 1956-08-25 [id number]  Reason for visit: Follow up elevated PSA, urinary symptoms, ED  HPI: 66 year-old African-American male from [place] with history of DVT/PE on Xarelto and long history of elevated PSA and extensive work-up.  To briefly summarize, he underwent a negative prostate biopsy in 2017 for an elevated PSA of 5, underwent a 4K score with a 4% risk of finding a clinically significant prostate cancer, as well as a negative prostate MRI in 2018 that showed no suspicious lesions.  Percentage free PSA has always been reassuring at >20%.  Prostate volume was 72 cc on prostate MRI.  PSA density is <0.15.  He has a history of isolated significantly elevated PSA values of 20-25 when the PSA was drawn within a few days of ejaculations, and these have all dropped down on repeat testing.  Urinalysis at our last visit was completely benign, and PVR was normal.  DRE has been benign  Most recent PSA on 08/27/2022 increased slightly to 11.4 from 9.5 last year.  He has had multiple PSA values > 20 previously that dropped down on repeat testing.  We again reviewed the AUA guidelines regarding PSA screening, as well as possible causes of false elevation including BPH, inflammation, recent sexual activity.  Overall has had a very reassuring work-up with low PSA density, negative biopsy, 4K score with only 4% chance of clinically significant prostate cancer, elevated percentage free PSA, and negative prostate MRI.  He has some mild urinary symptoms of frequency and occasional urgency, he drinks a fair amount of juice during the day and we reviewed behavioral strategies.  He is not at a point where he would think about adding another medication.  At our last visit he also reported some mild to moderate ED and opted for trial of Cialis 5 mg on demand.  Has had some improvements on that medication.  We discussed the max dose would be 20 mg on demand, and he could try  taking the 5 mg dose daily or every other day, which may improve some of the urinary symptoms as well.  -Behavioral strategies discussed regarding mild urinary symptoms -Cialis 5 mg can be taken daily or every other day, can titrate dose up as needed for refractory ED -RTC 6 months lab visit for PSA reflex to free prior.  If continues to increase consider repeat prostate MRI or 4K score.   Sondra Come, MD  [facility] [address], [address] [place], [place] [address] [phone]

## 2022-11-01 ENCOUNTER — Other Ambulatory Visit: Payer: Self-pay

## 2022-11-04 MED ORDER — LEVOTHYROXINE SODIUM 100 MCG PO TABS: 100.0000 ug | ORAL_TABLET | Freq: Every day | ORAL | 3 refills | Status: DC

## 2022-11-14 ENCOUNTER — Other Ambulatory Visit: Payer: Medicare Other

## 2022-11-15 ENCOUNTER — Other Ambulatory Visit: Payer: Medicare Other

## 2022-11-15 DIAGNOSIS — E039 Hypothyroidism, unspecified: Secondary | ICD-10-CM

## 2022-11-15 DIAGNOSIS — I10 Essential (primary) hypertension: Secondary | ICD-10-CM

## 2022-11-15 DIAGNOSIS — E785 Hyperlipidemia, unspecified: Secondary | ICD-10-CM

## 2022-11-15 DIAGNOSIS — R7301 Impaired fasting glucose: Secondary | ICD-10-CM

## 2022-11-16 LAB — CMP14+EGFR
ALT: 60 IU/L — ABNORMAL HIGH (ref 0–44)
AST: 30 IU/L (ref 0–40)
Albumin/Globulin Ratio: 1.3 (ref 1.2–2.2)
Albumin: 4.3 g/dL (ref 3.9–4.9)
Alkaline Phosphatase: 66 IU/L (ref 44–121)
BUN/Creatinine Ratio: 10 (ref 10–24)
BUN: 15 mg/dL (ref 8–27)
Bilirubin Total: 0.8 mg/dL (ref 0.0–1.2)
CO2: 24 mmol/L (ref 20–29)
Calcium: 9.4 mg/dL (ref 8.6–10.2)
Chloride: 103 mmol/L (ref 96–106)
Creatinine, Ser: 1.43 mg/dL — ABNORMAL HIGH (ref 0.76–1.27)
Globulin, Total: 3.4 g/dL (ref 1.5–4.5)
Glucose: 104 mg/dL — ABNORMAL HIGH (ref 70–99)
Potassium: 4.8 mmol/L (ref 3.5–5.2)
Sodium: 141 mmol/L (ref 134–144)
Total Protein: 7.7 g/dL (ref 6.0–8.5)
eGFR: 54 mL/min/{1.73_m2} — ABNORMAL LOW (ref >59)

## 2022-11-16 LAB — CBC WITH DIFFERENTIAL
Basophils Absolute: 0 10*3/uL (ref 0.0–0.2)
Basos: 0 %
EOS (ABSOLUTE): 0.1 10*3/uL (ref 0.0–0.4)
Eos: 1 %
Hematocrit: 42.6 % (ref 37.5–51.0)
Hemoglobin: 14.5 g/dL (ref 13.0–17.7)
Immature Grans (Abs): 0 10*3/uL (ref 0.0–0.1)
Immature Granulocytes: 0 %
Lymphocytes Absolute: 1.8 10*3/uL (ref 0.7–3.1)
Lymphs: 32 %
MCH: 32 pg (ref 26.6–33.0)
MCHC: 34 g/dL (ref 31.5–35.7)
MCV: 94 fL (ref 79–97)
Monocytes Absolute: 0.5 10*3/uL (ref 0.1–0.9)
Monocytes: 8 %
Neutrophils Absolute: 3.3 10*3/uL (ref 1.4–7.0)
Neutrophils: 59 %
RBC: 4.53 x10E6/uL (ref 4.14–5.80)
RDW: 11.7 % (ref 11.6–15.4)
WBC: 5.7 10*3/uL (ref 3.4–10.8)

## 2022-11-16 LAB — LIPID PANEL
Chol/HDL Ratio: 3.1 ratio (ref 0.0–5.0)
Cholesterol, Total: 135 mg/dL (ref 100–199)
HDL: 44 mg/dL (ref >39)
LDL Chol Calc (NIH): 76 mg/dL (ref 0–99)
Triglycerides: 78 mg/dL (ref 0–149)
VLDL Cholesterol Cal: 15 mg/dL (ref 5–40)

## 2022-11-16 LAB — HEMOGLOBIN A1C
Est. average glucose Bld gHb Est-mCnc: 111 mg/dL
Hgb A1c MFr Bld: 5.5 % (ref 4.8–5.6)

## 2022-11-16 LAB — TSH: TSH: 0.751 u[IU]/mL (ref 0.450–4.500)

## 2022-11-18 ENCOUNTER — Ambulatory Visit (INDEPENDENT_AMBULATORY_CARE_PROVIDER_SITE_OTHER): Payer: Medicare Other | Admitting: Internal Medicine

## 2022-11-18 ENCOUNTER — Encounter: Payer: Self-pay | Admitting: Internal Medicine

## 2022-11-18 VITALS — BP 130/90 | HR 84 | Ht 74.0 in | Wt 225.6 lb

## 2022-11-18 DIAGNOSIS — E782 Mixed hyperlipidemia: Secondary | ICD-10-CM | POA: Diagnosis not present

## 2022-11-18 DIAGNOSIS — R7989 Other specified abnormal findings of blood chemistry: Secondary | ICD-10-CM

## 2022-11-18 DIAGNOSIS — Z0001 Encounter for general adult medical examination with abnormal findings: Secondary | ICD-10-CM | POA: Diagnosis not present

## 2022-11-18 DIAGNOSIS — I10 Essential (primary) hypertension: Secondary | ICD-10-CM

## 2022-11-18 NOTE — Progress Notes (Signed)
Established Patient Office Visit  Subjective:  Patient ID: Fernando Cole, male    DOB: 1956/06/13  Age: 67 y.o. MRN: [id number]  Chief Complaint  Patient presents with   Follow-up    9 week lab results    No new complaints, here for AWV and refer to scanned documents.  No new complaints volunteered.Labs reviewed and notable for elevated transaminases with LDL at target, normal psa and cbc.     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)    Gout    Heart murmur    Hypertension    PE (pulmonary thromboembolism) ([place])     Past Surgical History:  Procedure Laterality Date   CATARACT EXTRACTION W/PHACO Left 06/18/2022   Procedure: CATARACT EXTRACTION PHACO AND INTRAOCULAR LENS PLACEMENT ([place]) LEFT eyhance toric;  Surgeon: Birder Robson, MD;  Location: [facility];  Service: Ophthalmology;  Laterality: Left;  4.32 00:26.2   CATARACT EXTRACTION W/PHACO Right 07/02/2022   Procedure: CATARACT EXTRACTION PHACO AND INTRAOCULAR LENS PLACEMENT ([facility]) RIGHT Eyhance Toric 3.41 00:28.0;  Surgeon: Birder Robson, MD;  Location: [facility];  Service: Ophthalmology;  Laterality: Right;   THYROIDECTOMY  2013    Social History   Socioeconomic History   Marital status: Married    Spouse name: Not on file   Number of children: Not on file   Years of education: Not on file   Highest education level: Not on file  Occupational History   Not on file  Tobacco Use   Smoking status: Former    Types: Cigarettes    Quit date: 2013    Years since quitting: 11.1   Smokeless tobacco: Never  Vaping Use   Vaping Use: Never used  Substance and Sexual Activity   Alcohol use: Never   Drug use: Never   Sexual activity: Not on file  Other Topics Concern   Not on file  Social History Narrative   Not on file   Social Determinants of Health   Financial Resource Strain: Not on file  Food Insecurity: Not on file  Transportation  Needs: Not on file  Physical Activity: Not on file  Stress: Not on file  Social Connections: Not on file  Intimate Partner Violence: Not on file    Family History  Problem Relation Age of Onset   Prolactinoma Neg Hx    Prostate cancer Neg Hx    Kidney cancer Neg Hx     No Known Allergies  Review of Systems  Constitutional: Negative.   HENT: Negative.    Eyes: Negative.   Respiratory: Negative.    Cardiovascular: Negative.   Gastrointestinal: Negative.   Genitourinary: Negative.   Musculoskeletal:  Positive for back pain.  Skin: Negative.   Neurological: Negative.   Endo/Heme/Allergies: Negative.        Objective:   BP (!) 130/90   Pulse 84   Ht '6\' 2"'$  (1.88 m)   Wt 225 lb 9.6 oz (102.3 kg)   SpO2 95%   BMI 28.97 kg/m   Vitals:   11/18/22 1037  BP: (!) 130/90  Pulse: 84  Height: '6\' 2"'$  (1.88 m)  Weight: 225 lb 9.6 oz (102.3 kg)  SpO2: 95%  BMI (Calculated): 28.95    Physical Exam Vitals reviewed.  Constitutional:      Appearance: Normal appearance.  HENT:     Head: Normocephalic.     Left Ear: There is no impacted cerumen.     Nose:  Nose normal.     Mouth/Throat:     Mouth: Mucous membranes are moist.     Pharynx: No posterior oropharyngeal erythema.  Eyes:     Extraocular Movements: Extraocular movements intact.     Pupils: Pupils are equal, round, and reactive to light.  Cardiovascular:     Rate and Rhythm: Regular rhythm.     Chest Wall: PMI is not displaced.     Pulses: Normal pulses.     Heart sounds: Normal heart sounds. No murmur heard.    Comments: With Ted hose in place. Pulmonary:     Effort: Pulmonary effort is normal.     Breath sounds: Normal air entry. No rhonchi or rales.  Abdominal:     General: Abdomen is flat. Bowel sounds are normal. There is no distension.     Palpations: Abdomen is soft. There is no hepatomegaly, splenomegaly or mass.     Tenderness: There is no abdominal tenderness.  Musculoskeletal:        General:  Normal range of motion.     Cervical back: Normal range of motion and neck supple.     Right lower leg: 1+ Pitting Edema present.     Left lower leg: 1+ Pitting Edema present.  Skin:    General: Skin is warm and dry.  Neurological:     General: No focal deficit present.     Mental Status: He is alert and oriented to person, place, and time.     Cranial Nerves: No cranial nerve deficit.     Motor: No weakness.  Psychiatric:        Mood and Affect: Mood normal.        Behavior: Behavior normal.      No results found for any visits on 11/18/22.  Recent Results (from the past 2160 hour(Ethelmae Ringel))  PSA Total (Reflex To Free)     Status: Abnormal   Collection Time: 08/27/22  9:14 AM  Result Value Ref Range   Prostate Specific Ag, Serum 11.4 (H) 0.0 - 4.0 ng/mL    Comment: Roche ECLIA methodology. According to the American Urological Association, Serum PSA should decrease and remain at undetectable levels after radical prostatectomy. The AUA defines biochemical recurrence as an initial PSA value 0.2 ng/mL or greater followed by a subsequent confirmatory PSA value 0.2 ng/mL or greater. Values obtained with different assay methods or kits cannot be used interchangeably. Results cannot be interpreted as absolute evidence of the presence or absence of malignant disease.    Reflex Criteria Comment     Comment: The percent free PSA is performed on a reflex basis only when the total PSA is between 4.0 and 10.0 ng/mL.   Hemoglobin A1c     Status: None   Collection Time: 11/15/22 11:58 AM  Result Value Ref Range   Hgb A1c MFr Bld 5.5 4.8 - 5.6 %    Comment:          Prediabetes: 5.7 - 6.4          Diabetes: >6.4          Glycemic control for adults with diabetes: <7.0    Est. average glucose Bld gHb Est-mCnc 111 mg/dL  TSH     Status: None   Collection Time: 11/15/22 11:58 AM  Result Value Ref Range   TSH 0.751 0.450 - 4.500 uIU/mL  CMP14+EGFR     Status: Abnormal   Collection Time:  11/15/22 11:58 AM  Result Value Ref Range   Glucose  104 (H) 70 - 99 mg/dL   BUN 15 8 - 27 mg/dL   Creatinine, Ser 1.43 (H) 0.76 - 1.27 mg/dL   eGFR 54 (L) >59 mL/min/1.73   BUN/Creatinine Ratio 10 10 - 24   Sodium 141 134 - 144 mmol/L   Potassium 4.8 3.5 - 5.2 mmol/L   Chloride 103 96 - 106 mmol/L   CO2 24 20 - 29 mmol/L   Calcium 9.4 8.6 - 10.2 mg/dL   Total Protein 7.7 6.0 - 8.5 g/dL   Albumin 4.3 3.9 - 4.9 g/dL   Globulin, Total 3.4 1.5 - 4.5 g/dL   Albumin/Globulin Ratio 1.3 1.2 - 2.2   Bilirubin Total 0.8 0.0 - 1.2 mg/dL   Alkaline Phosphatase 66 44 - 121 IU/L   AST 30 0 - 40 IU/L   ALT 60 (H) 0 - 44 IU/L  Lipid panel     Status: None   Collection Time: 11/15/22 11:58 AM  Result Value Ref Range   Cholesterol, Total 135 100 - 199 mg/dL   Triglycerides 78 0 - 149 mg/dL   HDL 44 >39 mg/dL   VLDL Cholesterol Cal 15 5 - 40 mg/dL   LDL Chol Calc (NIH) 76 0 - 99 mg/dL   Chol/HDL Ratio 3.1 0.0 - 5.0 ratio    Comment:                                   T. Chol/HDL Ratio                                             Men  Women                               1/2 Avg.Risk  3.4    3.3                                   Avg.Risk  5.0    4.4                                2X Avg.Risk  9.6    7.1                                3X Avg.Risk 23.4   11.0   CBC With Differential     Status: None   Collection Time: 11/15/22 11:58 AM  Result Value Ref Range   WBC 5.7 3.4 - 10.8 x10E3/uL   RBC 4.53 4.14 - 5.80 x10E6/uL   Hemoglobin 14.5 13.0 - 17.7 g/dL   Hematocrit 42.6 37.5 - 51.0 %   MCV 94 79 - 97 fL   MCH 32.0 26.6 - 33.0 pg   MCHC 34.0 31.5 - 35.7 g/dL   RDW 11.7 11.6 - 15.4 %   Neutrophils 59 Not Estab. %   Lymphs 32 Not Estab. %   Monocytes 8 Not Estab. %   Eos 1 Not Estab. %   Basos 0 Not Estab. %   Neutrophils Absolute 3.3 1.4 - 7.0 x10E3/uL   Lymphocytes Absolute  1.8 0.7 - 3.1 x10E3/uL   Monocytes Absolute 0.5 0.1 - 0.9 x10E3/uL   EOS (ABSOLUTE) 0.1 0.0 - 0.4 x10E3/uL    Basophils Absolute 0.0 0.0 - 0.2 x10E3/uL   Immature Granulocytes 0 Not Estab. %   Immature Grans (Abs) 0.0 0.0 - 0.1 x10E3/uL      Assessment & Plan:   Problem List Items Addressed This Visit   None Visit Diagnoses     Mixed hyperlipidemia    -  Primary   Relevant Orders   Comprehensive metabolic panel   Lipid panel   Primary hypertension       Abnormal liver function test       Relevant Orders   US Abdomen Limited RUQ (LIVER/GB)   Hepatitis A Antibody, Total   Hepatitis B Surface Antigen   HCV RNA quant   Ferritin   Ceruloplasmin     1. Mixed hyperlipidemia - Comprehensive metabolic panel; Future - Lipid panel; Future  2. Primary hypertension  3. Abnormal liver function test - US Abdomen Limited RUQ (LIVER/GB); Future - Hepatitis A Antibody, Total - Hepatitis B Surface Antigen - HCV RNA quant - Ferritin - Ceruloplasmin    Return in about 2 weeks (around 12/02/2022) for lab results.   Total time spent: 30 minutes  Volanda Napoleon, MD  11/18/2022

## 2022-11-20 ENCOUNTER — Other Ambulatory Visit: Payer: Medicare Other

## 2022-11-21 LAB — CERULOPLASMIN: Ceruloplasmin: 22.5 mg/dL (ref 16.0–31.0)

## 2022-11-21 LAB — FERRITIN: Ferritin: 281 ng/mL (ref 30–400)

## 2022-11-21 LAB — HCV RNA QUANT: Hepatitis C Quantitation: NOT DETECTED IU/mL

## 2022-11-21 LAB — HEPATITIS B SURFACE ANTIGEN: Hepatitis B Surface Ag: NEGATIVE

## 2022-11-21 LAB — HEPATITIS A ANTIBODY, TOTAL: hep A Total Ab: POSITIVE — AB

## 2022-11-27 ENCOUNTER — Other Ambulatory Visit: Payer: Medicare Other

## 2022-12-02 ENCOUNTER — Encounter: Payer: Self-pay | Admitting: Internal Medicine

## 2022-12-02 ENCOUNTER — Ambulatory Visit (INDEPENDENT_AMBULATORY_CARE_PROVIDER_SITE_OTHER): Payer: Medicare Other | Admitting: Internal Medicine

## 2022-12-02 VITALS — BP 122/90 | HR 79 | Temp 97.4°F | Ht 74.0 in | Wt 226.0 lb

## 2022-12-02 DIAGNOSIS — E038 Other specified hypothyroidism: Secondary | ICD-10-CM

## 2022-12-02 DIAGNOSIS — R7989 Other specified abnormal findings of blood chemistry: Secondary | ICD-10-CM | POA: Diagnosis not present

## 2022-12-02 DIAGNOSIS — I10 Essential (primary) hypertension: Secondary | ICD-10-CM

## 2022-12-02 DIAGNOSIS — E782 Mixed hyperlipidemia: Secondary | ICD-10-CM

## 2022-12-02 MED ORDER — OLMESARTAN MEDOXOMIL-HCTZ 40-12.5 MG PO TABS: 1.0000 | ORAL_TABLET | Freq: Every day | ORAL | 0 refills | Status: DC

## 2022-12-02 MED ORDER — AMLODIPINE BESY-BENAZEPRIL HCL 10-40 MG PO CAPS: 1.0000 | ORAL_CAPSULE | Freq: Every day | ORAL | 1 refills | Status: DC

## 2022-12-02 NOTE — Progress Notes (Signed)
Established Patient Office Visit  Subjective:  Patient ID: Fernando Cole, male    DOB: Jul 24, 1956  Age: 67 y.o. MRN: [id number]  Chief Complaint  Patient presents with   Follow-up    2 week lab results    No new complaints, here for lab review and imaging results however uss had to be rescheduled.Labs reviewed and notable for negative Hep B and C with normal ferritin and ceruloplasmin with positive Hep A atbs.      No other concerns at this time.   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)    Gout    Heart murmur    Hypertension    PE (pulmonary thromboembolism) ([place])     Past Surgical History:  Procedure Laterality Date   CATARACT EXTRACTION W/PHACO Left 06/18/2022   Procedure: CATARACT EXTRACTION PHACO AND INTRAOCULAR LENS PLACEMENT ([place]) LEFT eyhance toric;  Surgeon: Birder Robson, MD;  Location: [facility];  Service: Ophthalmology;  Laterality: Left;  4.32 00:26.2   CATARACT EXTRACTION W/PHACO Right 07/02/2022   Procedure: CATARACT EXTRACTION PHACO AND INTRAOCULAR LENS PLACEMENT ([facility]) RIGHT Eyhance Toric 3.41 00:28.0;  Surgeon: Birder Robson, MD;  Location: [facility];  Service: Ophthalmology;  Laterality: Right;   THYROIDECTOMY  2013    Social History   Socioeconomic History   Marital status: Married    Spouse name: Not on file   Number of children: Not on file   Years of education: Not on file   Highest education level: Not on file  Occupational History   Not on file  Tobacco Use   Smoking status: Former    Types: Cigarettes    Quit date: 2013    Years since quitting: 11.2   Smokeless tobacco: Never  Vaping Use   Vaping Use: Never used  Substance and Sexual Activity   Alcohol use: Never   Drug use: Never   Sexual activity: Not on file  Other Topics Concern   Not on file  Social History Narrative   Not on file   Social Determinants of Health   Financial Resource Strain: Not on  file  Food Insecurity: Not on file  Transportation Needs: Not on file  Physical Activity: Not on file  Stress: Not on file  Social Connections: Not on file  Intimate Partner Violence: Not on file    Family History  Problem Relation Age of Onset   Prolactinoma Neg Hx    Prostate cancer Neg Hx    Kidney cancer Neg Hx     No Known Allergies  Review of Systems  Constitutional: Negative.   HENT: Negative.    Eyes: Negative.   Respiratory: Negative.    Cardiovascular: Negative.   Gastrointestinal: Negative.   Genitourinary: Negative.   Skin: Negative.   Neurological: Negative.   Endo/Heme/Allergies: Negative.        Objective:   BP (!) 122/90   Pulse 79   Temp (!) 97.4 F (36.3 C) (Tympanic)   Ht 6\' 2"  (1.88 m)   Wt 226 lb (102.5 kg)   SpO2 96%   BMI 29.02 kg/m   Vitals:   12/02/22 1042  BP: (!) 122/90  Pulse: 79  Temp: (!) 97.4 F (36.3 C)  Height: 6\' 2"  (1.88 m)  Weight: 226 lb (102.5 kg)  SpO2: 96%  TempSrc: Tympanic  BMI (Calculated): 29    Physical Exam Vitals reviewed.  Constitutional:      Appearance: Normal appearance.  HENT:  Head: Normocephalic.     Left Ear: There is no impacted cerumen.     Nose: Nose normal.     Mouth/Throat:     Mouth: Mucous membranes are moist.     Pharynx: No posterior oropharyngeal erythema.  Eyes:     Extraocular Movements: Extraocular movements intact.     Pupils: Pupils are equal, round, and reactive to light.  Cardiovascular:     Rate and Rhythm: Regular rhythm.     Chest Wall: PMI is not displaced.     Pulses: Normal pulses.     Heart sounds: Normal heart sounds. No murmur heard. Pulmonary:     Effort: Pulmonary effort is normal.     Breath sounds: Normal air entry. No rhonchi or rales.  Abdominal:     General: Abdomen is flat. Bowel sounds are normal. There is no distension.     Palpations: Abdomen is soft. There is no hepatomegaly, splenomegaly or mass.     Tenderness: There is no abdominal  tenderness.  Musculoskeletal:        General: Normal range of motion.     Cervical back: Normal range of motion and neck supple.     Right lower leg: No edema.     Left lower leg: No edema.  Skin:    General: Skin is warm and dry.  Neurological:     General: No focal deficit present.     Mental Status: He is alert and oriented to person, place, and time.     Cranial Nerves: No cranial nerve deficit.     Motor: No weakness.  Psychiatric:        Mood and Affect: Mood normal.        Behavior: Behavior normal.      No results found for any visits on 12/02/22.  Recent Results (from the past 2160 hour(Korvin Valentine))  Hemoglobin A1c     Status: None   Collection Time: 11/15/22 11:58 AM  Result Value Ref Range   Hgb A1c MFr Bld 5.5 4.8 - 5.6 %    Comment:          Prediabetes: 5.7 - 6.4          Diabetes: >6.4          Glycemic control for adults with diabetes: <7.0    Est. average glucose Bld gHb Est-mCnc 111 mg/dL  TSH     Status: None   Collection Time: 11/15/22 11:58 AM  Result Value Ref Range   TSH 0.751 0.450 - 4.500 uIU/mL  CMP14+EGFR     Status: Abnormal   Collection Time: 11/15/22 11:58 AM  Result Value Ref Range   Glucose 104 (H) 70 - 99 mg/dL   BUN 15 8 - 27 mg/dL   Creatinine, Ser 1.43 (H) 0.76 - 1.27 mg/dL   eGFR 54 (L) >59 mL/min/1.73   BUN/Creatinine Ratio 10 10 - 24   Sodium 141 134 - 144 mmol/L   Potassium 4.8 3.5 - 5.2 mmol/L   Chloride 103 96 - 106 mmol/L   CO2 24 20 - 29 mmol/L   Calcium 9.4 8.6 - 10.2 mg/dL   Total Protein 7.7 6.0 - 8.5 g/dL   Albumin 4.3 3.9 - 4.9 g/dL   Globulin, Total 3.4 1.5 - 4.5 g/dL   Albumin/Globulin Ratio 1.3 1.2 - 2.2   Bilirubin Total 0.8 0.0 - 1.2 mg/dL   Alkaline Phosphatase 66 44 - 121 IU/L   AST 30 0 - 40 IU/L   ALT 60 (  H) 0 - 44 IU/L  Lipid panel     Status: None   Collection Time: 11/15/22 11:58 AM  Result Value Ref Range   Cholesterol, Total 135 100 - 199 mg/dL   Triglycerides 78 0 - 149 mg/dL   HDL 44 >39 mg/dL    VLDL Cholesterol Cal 15 5 - 40 mg/dL   LDL Chol Calc (NIH) 76 0 - 99 mg/dL   Chol/HDL Ratio 3.1 0.0 - 5.0 ratio    Comment:                                   T. Chol/HDL Ratio                                             Men  Women                               1/2 Avg.Risk  3.4    3.3                                   Avg.Risk  5.0    4.4                                2X Avg.Risk  9.6    7.1                                3X Avg.Risk 23.4   11.0   CBC With Differential     Status: None   Collection Time: 11/15/22 11:58 AM  Result Value Ref Range   WBC 5.7 3.4 - 10.8 x10E3/uL   RBC 4.53 4.14 - 5.80 x10E6/uL   Hemoglobin 14.5 13.0 - 17.7 g/dL   Hematocrit 42.6 37.5 - 51.0 %   MCV 94 79 - 97 fL   MCH 32.0 26.6 - 33.0 pg   MCHC 34.0 31.5 - 35.7 g/dL   RDW 11.7 11.6 - 15.4 %   Neutrophils 59 Not Estab. %   Lymphs 32 Not Estab. %   Monocytes 8 Not Estab. %   Eos 1 Not Estab. %   Basos 0 Not Estab. %   Neutrophils Absolute 3.3 1.4 - 7.0 x10E3/uL   Lymphocytes Absolute 1.8 0.7 - 3.1 x10E3/uL   Monocytes Absolute 0.5 0.1 - 0.9 x10E3/uL   EOS (ABSOLUTE) 0.1 0.0 - 0.4 x10E3/uL   Basophils Absolute 0.0 0.0 - 0.2 x10E3/uL   Immature Granulocytes 0 Not Estab. %   Immature Grans (Abs) 0.0 0.0 - 0.1 x10E3/uL  Ferritin     Status: None   Collection Time: 11/20/22 10:46 AM  Result Value Ref Range   Ferritin 281 30 - 400 ng/mL  Ceruloplasmin     Status: None   Collection Time: 11/20/22 10:46 AM  Result Value Ref Range   Ceruloplasmin 22.5 16.0 - 31.0 mg/dL  Hepatitis A Antibody, Total     Status: Abnormal   Collection Time: 11/20/22 10:49 AM  Result Value Ref Range   hep A Total Ab Positive (A) Negative    Comment:  Comment: The HAV total antibody assay detects both IgG and IgM but does not differentiate between them. A negative result suggests susceptibility to infection. A positive result could be due to vaccination, previously resolved infection or active infection. Testing for HAV  IgM should be performed if active HAV infection is suspected. [facility] offers profiles that will automatically reflex positive HAV total antibody results to IgM (e.g., panel [phone] HAV Antibody w/ Rfx).   Hepatitis B Surface Antigen     Status: None   Collection Time: 11/20/22 10:49 AM  Result Value Ref Range   Hepatitis B Surface Ag Negative Negative  HCV RNA quant     Status: None   Collection Time: 11/20/22 10:49 AM  Result Value Ref Range   Hepatitis C Quantitation HCV Not Detected IU/mL   Test Information Comment     Comment: The quantitative range of this assay is 15 IU/mL to 100 million IU/mL.      Assessment & Plan:   Problem List Items Addressed This Visit   None Replace lotrel with Olmesartan hct due to leg edema. No follow-ups on file.   Total time spent: 20 minutes  Volanda Napoleon, MD  12/02/2022

## 2022-12-04 ENCOUNTER — Other Ambulatory Visit (INDEPENDENT_AMBULATORY_CARE_PROVIDER_SITE_OTHER): Payer: Medicare Other

## 2022-12-04 DIAGNOSIS — R7989 Other specified abnormal findings of blood chemistry: Secondary | ICD-10-CM

## 2022-12-10 ENCOUNTER — Other Ambulatory Visit: Payer: Self-pay | Admitting: Internal Medicine

## 2023-01-17 ENCOUNTER — Other Ambulatory Visit: Payer: Self-pay

## 2023-01-17 MED ORDER — RIVAROXABAN 10 MG PO TABS: 10.0000 mg | ORAL_TABLET | Freq: Every day | ORAL | 3 refills | Status: DC

## 2023-02-07 ENCOUNTER — Other Ambulatory Visit: Payer: Self-pay | Admitting: Internal Medicine

## 2023-02-07 ENCOUNTER — Other Ambulatory Visit: Payer: Medicare Other

## 2023-02-08 LAB — COMPREHENSIVE METABOLIC PANEL
ALT: 30 IU/L (ref 0–44)
AST: 20 IU/L (ref 0–40)
Albumin/Globulin Ratio: 1.3 (ref 1.2–2.2)
Albumin: 4.3 g/dL (ref 3.9–4.9)
Alkaline Phosphatase: 61 IU/L (ref 44–121)
BUN/Creatinine Ratio: 13 (ref 10–24)
BUN: 16 mg/dL (ref 8–27)
Bilirubin Total: 0.6 mg/dL (ref 0.0–1.2)
CO2: 24 mmol/L (ref 20–29)
Calcium: 9.3 mg/dL (ref 8.6–10.2)
Chloride: 104 mmol/L (ref 96–106)
Creatinine, Ser: 1.19 mg/dL (ref 0.76–1.27)
Globulin, Total: 3.3 g/dL (ref 1.5–4.5)
Glucose: 93 mg/dL (ref 70–99)
Potassium: 4.4 mmol/L (ref 3.5–5.2)
Sodium: 142 mmol/L (ref 134–144)
Total Protein: 7.6 g/dL (ref 6.0–8.5)
eGFR: 67 mL/min/{1.73_m2} (ref >59)

## 2023-02-08 LAB — CBC WITH DIFFERENTIAL/PLATELET
Basophils Absolute: 0 10*3/uL (ref 0.0–0.2)
Basos: 0 %
EOS (ABSOLUTE): 0.1 10*3/uL (ref 0.0–0.4)
Eos: 1 %
Hematocrit: 37.9 % (ref 37.5–51.0)
Hemoglobin: 12.9 g/dL — ABNORMAL LOW (ref 13.0–17.7)
Immature Grans (Abs): 0 10*3/uL (ref 0.0–0.1)
Immature Granulocytes: 0 %
Lymphocytes Absolute: 1.9 10*3/uL (ref 0.7–3.1)
Lymphs: 34 %
MCH: 32.3 pg (ref 26.6–33.0)
MCHC: 34 g/dL (ref 31.5–35.7)
MCV: 95 fL (ref 79–97)
Monocytes Absolute: 0.4 10*3/uL (ref 0.1–0.9)
Monocytes: 7 %
Neutrophils Absolute: 3.2 10*3/uL (ref 1.4–7.0)
Neutrophils: 58 %
Platelets: 287 10*3/uL (ref 150–450)
RBC: 3.99 x10E6/uL — ABNORMAL LOW (ref 4.14–5.80)
RDW: 12.2 % (ref 11.6–15.4)
WBC: 5.5 10*3/uL (ref 3.4–10.8)

## 2023-02-08 LAB — HGB A1C W/O EAG: Hgb A1c MFr Bld: 5.4 % (ref 4.8–5.6)

## 2023-02-08 LAB — LIPID PANEL W/O CHOL/HDL RATIO
Cholesterol, Total: 126 mg/dL (ref 100–199)
HDL: 46 mg/dL (ref >39)
LDL Chol Calc (NIH): 68 mg/dL (ref 0–99)
Triglycerides: 54 mg/dL (ref 0–149)
VLDL Cholesterol Cal: 12 mg/dL (ref 5–40)

## 2023-02-08 LAB — TSH: TSH: 1.21 u[IU]/mL (ref 0.450–4.500)

## 2023-02-11 ENCOUNTER — Encounter: Payer: Self-pay | Admitting: Internal Medicine

## 2023-02-11 ENCOUNTER — Ambulatory Visit (INDEPENDENT_AMBULATORY_CARE_PROVIDER_SITE_OTHER): Payer: Medicare Other | Admitting: Internal Medicine

## 2023-02-11 VITALS — BP 134/84 | HR 81 | Ht 74.0 in | Wt 221.4 lb

## 2023-02-11 DIAGNOSIS — E038 Other specified hypothyroidism: Secondary | ICD-10-CM | POA: Diagnosis not present

## 2023-02-11 DIAGNOSIS — I10 Essential (primary) hypertension: Secondary | ICD-10-CM | POA: Diagnosis not present

## 2023-02-11 DIAGNOSIS — E782 Mixed hyperlipidemia: Secondary | ICD-10-CM

## 2023-02-11 MED ORDER — OLMESARTAN MEDOXOMIL-HCTZ 40-12.5 MG PO TABS
1.0000 | ORAL_TABLET | Freq: Every day | ORAL | 0 refills | Status: DC
Start: 2023-02-11 — End: 2023-05-14

## 2023-02-11 NOTE — Progress Notes (Signed)
Established Patient Office Visit  Subjective:  Patient ID: Fernando Cole, male    DOB: 27-Sep-1955  Age: 67 y.o. MRN: [id number]  Chief Complaint  Patient presents with   Follow-up    10 week follow up    No new complaints, here for lab review and medication refills. Labs reviewed and notable for normalization of renal function, improvement in lipids and A1c which are well controlled.    No other concerns at this time.   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)    Gout    Heart murmur    Hypertension    PE (pulmonary thromboembolism) (HCC)     Past Surgical History:  Procedure Laterality Date   CATARACT EXTRACTION W/PHACO Left 06/18/2022   Procedure: CATARACT EXTRACTION PHACO AND INTRAOCULAR LENS PLACEMENT (IOC) LEFT eyhance toric;  Surgeon: Galen Manila, MD;  Location: [facility];  Service: Ophthalmology;  Laterality: Left;  4.32 00:26.2   CATARACT EXTRACTION W/PHACO Right 07/02/2022   Procedure: CATARACT EXTRACTION PHACO AND INTRAOCULAR LENS PLACEMENT (IOC) RIGHT Eyhance Toric 3.41 00:28.0;  Surgeon: Galen Manila, MD;  Location: [facility];  Service: Ophthalmology;  Laterality: Right;   THYROIDECTOMY  2013    Social History   Socioeconomic History   Marital status: Married    Spouse name: Not on file   Number of children: Not on file   Years of education: Not on file   Highest education level: Not on file  Occupational History   Not on file  Tobacco Use   Smoking status: Former    Types: Cigarettes    Quit date: 2013    Years since quitting: 11.4   Smokeless tobacco: Never  Vaping Use   Vaping Use: Never used  Substance and Sexual Activity   Alcohol use: Never   Drug use: Never   Sexual activity: Not on file  Other Topics Concern   Not on file  Social History Narrative   Not on file   Social Determinants of Health   Financial Resource Strain: Not on file  Food Insecurity: Not  on file  Transportation Needs: Not on file  Physical Activity: Not on file  Stress: Not on file  Social Connections: Not on file  Intimate Partner Violence: Not on file    Family History  Problem Relation Age of Onset   Prolactinoma Neg Hx    Prostate cancer Neg Hx    Kidney cancer Neg Hx     No Known Allergies  Review of Systems  Constitutional: Negative.   HENT: Negative.    Eyes: Negative.   Respiratory: Negative.    Cardiovascular: Negative.   Gastrointestinal: Negative.   Genitourinary: Negative.   Skin: Negative.   Neurological: Negative.   Endo/Heme/Allergies: Negative.        Objective:   BP 134/84   Pulse 81   Ht 6\' 2"  (1.88 m)   Wt 221 lb 6.4 oz (100.4 kg)   SpO2 96%   BMI 28.43 kg/m   Vitals:   02/11/23 1006  BP: 134/84  Pulse: 81  Height: 6\' 2"  (1.88 m)  Weight: 221 lb 6.4 oz (100.4 kg)  SpO2: 96%  BMI (Calculated): 28.41    Physical Exam Vitals reviewed.  Constitutional:      Appearance: Normal appearance.  HENT:     Head: Normocephalic.     Left Ear: There is no impacted cerumen.     Nose: Nose normal.  Mouth/Throat:     Mouth: Mucous membranes are moist.     Pharynx: No posterior oropharyngeal erythema.  Eyes:     Extraocular Movements: Extraocular movements intact.     Pupils: Pupils are equal, round, and reactive to light.  Cardiovascular:     Rate and Rhythm: Regular rhythm.     Chest Wall: PMI is not displaced.     Pulses: Normal pulses.     Heart sounds: Normal heart sounds. No murmur heard.    Comments: With Ted hose in place. Pulmonary:     Effort: Pulmonary effort is normal.     Breath sounds: Normal air entry. No rhonchi or rales.  Abdominal:     General: Abdomen is flat. Bowel sounds are normal. There is no distension.     Palpations: Abdomen is soft. There is no hepatomegaly, splenomegaly or mass.     Tenderness: There is no abdominal tenderness.  Musculoskeletal:        General: Normal range of motion.      Cervical back: Normal range of motion and neck supple.     Right lower leg: 1+ Pitting Edema present.     Left lower leg: 1+ Pitting Edema present.  Skin:    General: Skin is warm and dry.  Neurological:     General: No focal deficit present.     Mental Status: He is alert and oriented to person, place, and time.     Cranial Nerves: No cranial nerve deficit.     Motor: No weakness.  Psychiatric:        Mood and Affect: Mood normal.        Behavior: Behavior normal.      No results found for any visits on 02/11/23.  Recent Results (from the past 2160 hour(Consandra Laske))  Hemoglobin A1c     Status: None   Collection Time: 11/15/22 11:58 AM  Result Value Ref Range   Hgb A1c MFr Bld 5.5 4.8 - 5.6 %    Comment:          Prediabetes: 5.7 - 6.4          Diabetes: >6.4          Glycemic control for adults with diabetes: <7.0    Est. average glucose Bld gHb Est-mCnc 111 mg/dL  TSH     Status: None   Collection Time: 11/15/22 11:58 AM  Result Value Ref Range   TSH 0.751 0.450 - 4.500 uIU/mL  CMP14+EGFR     Status: Abnormal   Collection Time: 11/15/22 11:58 AM  Result Value Ref Range   Glucose 104 (H) 70 - 99 mg/dL   BUN 15 8 - 27 mg/dL   Creatinine, Ser 1.61 (H) 0.76 - 1.27 mg/dL   eGFR 54 (L) >09 UE/AVW/0.98   BUN/Creatinine Ratio 10 10 - 24   Sodium 141 134 - 144 mmol/L   Potassium 4.8 3.5 - 5.2 mmol/L   Chloride 103 96 - 106 mmol/L   CO2 24 20 - 29 mmol/L   Calcium 9.4 8.6 - 10.2 mg/dL   Total Protein 7.7 6.0 - 8.5 g/dL   Albumin 4.3 3.9 - 4.9 g/dL   Globulin, Total 3.4 1.5 - 4.5 g/dL   Albumin/Globulin Ratio 1.3 1.2 - 2.2   Bilirubin Total 0.8 0.0 - 1.2 mg/dL   Alkaline Phosphatase 66 44 - 121 IU/L   AST 30 0 - 40 IU/L   ALT 60 (H) 0 - 44 IU/L  Lipid panel  Status: None   Collection Time: 11/15/22 11:58 AM  Result Value Ref Range   Cholesterol, Total 135 100 - 199 mg/dL   Triglycerides 78 0 - 149 mg/dL   HDL 44 >60 mg/dL   VLDL Cholesterol Cal 15 5 - 40 mg/dL   LDL Chol  Calc (NIH) 76 0 - 99 mg/dL   Chol/HDL Ratio 3.1 0.0 - 5.0 ratio    Comment:                                   T. Chol/HDL Ratio                                             Men  Women                               1/2 Avg.Risk  3.4    3.3                                   Avg.Risk  5.0    4.4                                2X Avg.Risk  9.6    7.1                                3X Avg.Risk 23.4   11.0   CBC With Differential     Status: None   Collection Time: 11/15/22 11:58 AM  Result Value Ref Range   WBC 5.7 3.4 - 10.8 x10E3/uL   RBC 4.53 4.14 - 5.80 x10E6/uL   Hemoglobin 14.5 13.0 - 17.7 g/dL   Hematocrit 45.4 09.8 - 51.0 %   MCV 94 79 - 97 fL   MCH 32.0 26.6 - 33.0 pg   MCHC 34.0 31.5 - 35.7 g/dL   RDW 11.9 14.7 - 82.9 %   Neutrophils 59 Not Estab. %   Lymphs 32 Not Estab. %   Monocytes 8 Not Estab. %   Eos 1 Not Estab. %   Basos 0 Not Estab. %   Neutrophils Absolute 3.3 1.4 - 7.0 x10E3/uL   Lymphocytes Absolute 1.8 0.7 - 3.1 x10E3/uL   Monocytes Absolute 0.5 0.1 - 0.9 x10E3/uL   EOS (ABSOLUTE) 0.1 0.0 - 0.4 x10E3/uL   Basophils Absolute 0.0 0.0 - 0.2 x10E3/uL   Immature Granulocytes 0 Not Estab. %   Immature Grans (Abs) 0.0 0.0 - 0.1 x10E3/uL  Ferritin     Status: None   Collection Time: 11/20/22 10:46 AM  Result Value Ref Range   Ferritin 281 30 - 400 ng/mL  Ceruloplasmin     Status: None   Collection Time: 11/20/22 10:46 AM  Result Value Ref Range   Ceruloplasmin 22.5 16.0 - 31.0 mg/dL  Hepatitis A Antibody, Total     Status: Abnormal   Collection Time: 11/20/22 10:49 AM  Result Value Ref Range   hep A Total Ab Positive (A) Negative    Comment: Comment: The HAV total antibody assay detects both IgG and IgM but  does not differentiate between them. A negative result suggests susceptibility to infection. A positive result could be due to vaccination, previously resolved infection or active infection. Testing for HAV IgM should be performed if active HAV infection  is suspected. [facility] offers profiles that will automatically reflex positive HAV total antibody results to IgM (e.g., panel [id number] HAV Antibody w/ Rfx).   Hepatitis B Surface Antigen     Status: None   Collection Time: 11/20/22 10:49 AM  Result Value Ref Range   Hepatitis B Surface Ag Negative Negative  HCV RNA quant     Status: None   Collection Time: 11/20/22 10:49 AM  Result Value Ref Range   Hepatitis C Quantitation HCV Not Detected IU/mL   Test Information Comment     Comment: The quantitative range of this assay is 15 IU/mL to 100 million IU/mL.  CBC with Differential/Platelet     Status: Abnormal   Collection Time: 02/07/23 10:25 AM  Result Value Ref Range   WBC 5.5 3.4 - 10.8 x10E3/uL   RBC 3.99 (L) 4.14 - 5.80 x10E6/uL   Hemoglobin 12.9 (L) 13.0 - 17.7 g/dL   Hematocrit 04.5 40.9 - 51.0 %   MCV 95 79 - 97 fL   MCH 32.3 26.6 - 33.0 pg   MCHC 34.0 31.5 - 35.7 g/dL   RDW 81.1 91.4 - 78.2 %   Platelets 287 150 - 450 x10E3/uL   Neutrophils 58 Not Estab. %   Lymphs 34 Not Estab. %   Monocytes 7 Not Estab. %   Eos 1 Not Estab. %   Basos 0 Not Estab. %   Neutrophils Absolute 3.2 1.4 - 7.0 x10E3/uL   Lymphocytes Absolute 1.9 0.7 - 3.1 x10E3/uL   Monocytes Absolute 0.4 0.1 - 0.9 x10E3/uL   EOS (ABSOLUTE) 0.1 0.0 - 0.4 x10E3/uL   Basophils Absolute 0.0 0.0 - 0.2 x10E3/uL   Immature Granulocytes 0 Not Estab. %   Immature Grans (Abs) 0.0 0.0 - 0.1 x10E3/uL  Comprehensive metabolic panel     Status: None   Collection Time: 02/07/23 10:25 AM  Result Value Ref Range   Glucose 93 70 - 99 mg/dL   BUN 16 8 - 27 mg/dL   Creatinine, Ser 9.56 0.76 - 1.27 mg/dL   eGFR 67 >21 HY/QMV/7.84   BUN/Creatinine Ratio 13 10 - 24   Sodium 142 134 - 144 mmol/L   Potassium 4.4 3.5 - 5.2 mmol/L   Chloride 104 96 - 106 mmol/L   CO2 24 20 - 29 mmol/L   Calcium 9.3 8.6 - 10.2 mg/dL   Total Protein 7.6 6.0 - 8.5 g/dL   Albumin 4.3 3.9 - 4.9 g/dL   Globulin, Total 3.3 1.5 - 4.5 g/dL    Albumin/Globulin Ratio 1.3 1.2 - 2.2   Bilirubin Total 0.6 0.0 - 1.2 mg/dL   Alkaline Phosphatase 61 44 - 121 IU/L   AST 20 0 - 40 IU/L   ALT 30 0 - 44 IU/L  Lipid Panel w/o Chol/HDL Ratio     Status: None   Collection Time: 02/07/23 10:25 AM  Result Value Ref Range   Cholesterol, Total 126 100 - 199 mg/dL   Triglycerides 54 0 - 149 mg/dL   HDL 46 >69 mg/dL   VLDL Cholesterol Cal 12 5 - 40 mg/dL   LDL Chol Calc (NIH) 68 0 - 99 mg/dL  Hgb G2X w/o eAG     Status: None   Collection Time: 02/07/23 10:25 AM  Result Value  Ref Range   Hgb A1c MFr Bld 5.4 4.8 - 5.6 %    Comment:          Prediabetes: 5.7 - 6.4          Diabetes: >6.4          Glycemic control for adults with diabetes: <7.0   TSH     Status: None   Collection Time: 02/07/23 10:25 AM  Result Value Ref Range   TSH 1.210 0.450 - 4.500 uIU/mL      Assessment & Plan:  As per problem list  Problem List Items Addressed This Visit   None Visit Diagnoses     Other specified hypothyroidism    -  Primary   Relevant Orders   TSH   Primary hypertension       Relevant Medications   hydrALAZINE (APRESOLINE) 25 MG tablet   olmesartan-hydrochlorothiazide (BENICAR HCT) 40-12.5 MG tablet   Mixed hyperlipidemia       Relevant Medications   hydrALAZINE (APRESOLINE) 25 MG tablet   olmesartan-hydrochlorothiazide (BENICAR HCT) 40-12.5 MG tablet   Other Relevant Orders   Comprehensive metabolic panel   Lipid panel       No follow-ups on file.   Total time spent: 20 minutes  Luna Fuse, MD  02/11/2023   This document may have been prepared by [facility] Voice Recognition software and as such may include unintentional dictation errors.

## 2023-03-07 ENCOUNTER — Other Ambulatory Visit: Payer: Medicare Other

## 2023-03-07 DIAGNOSIS — R972 Elevated prostate specific antigen [PSA]: Secondary | ICD-10-CM

## 2023-03-08 LAB — PSA TOTAL (REFLEX TO FREE): Prostate Specific Ag, Serum: 12.7 ng/mL — ABNORMAL HIGH (ref 0.0–4.0)

## 2023-04-11 ENCOUNTER — Other Ambulatory Visit: Payer: Self-pay | Admitting: Internal Medicine

## 2023-04-26 ENCOUNTER — Emergency Department: Payer: Medicare Other

## 2023-04-26 ENCOUNTER — Other Ambulatory Visit: Payer: Self-pay

## 2023-04-26 DIAGNOSIS — I10 Essential (primary) hypertension: Secondary | ICD-10-CM | POA: Insufficient documentation

## 2023-04-26 DIAGNOSIS — M25461 Effusion, right knee: Secondary | ICD-10-CM | POA: Insufficient documentation

## 2023-04-26 DIAGNOSIS — M25561 Pain in right knee: Secondary | ICD-10-CM | POA: Diagnosis present

## 2023-04-26 LAB — CBC
HCT: 37.9 % — ABNORMAL LOW (ref 39.0–52.0)
Hemoglobin: 13.3 g/dL (ref 13.0–17.0)
MCH: 33.2 pg (ref 26.0–34.0)
MCHC: 35.1 g/dL (ref 30.0–36.0)
MCV: 94.5 fL (ref 80.0–100.0)
Platelets: 244 10*3/uL (ref 150–400)
RBC: 4.01 MIL/uL — ABNORMAL LOW (ref 4.22–5.81)
RDW: 12.3 % (ref 11.5–15.5)
WBC: 10.1 10*3/uL (ref 4.0–10.5)
nRBC: 0 % (ref 0.0–0.2)

## 2023-04-26 NOTE — ED Triage Notes (Addendum)
Right knee swelling for about 3 days, hx of the same about 2 months ago "I had fluid on my knee and the orthopedic doctor drained it"  Denies injury or fevers. Right knee swelling, mild redness and warmth. No meds for the same.

## 2023-04-27 ENCOUNTER — Emergency Department
Admission: EM | Admit: 2023-04-27 | Discharge: 2023-04-27 | Disposition: A | Discharge status: Home / Self Care | Payer: Medicare Other | Attending: Emergency Medicine | Admitting: Emergency Medicine

## 2023-04-27 DIAGNOSIS — M25561 Pain in right knee: Secondary | ICD-10-CM | POA: Diagnosis not present

## 2023-04-27 DIAGNOSIS — M109 Gout, unspecified: Secondary | ICD-10-CM

## 2023-04-27 DIAGNOSIS — M25461 Effusion, right knee: Secondary | ICD-10-CM

## 2023-04-27 LAB — SYNOVIAL CELL COUNT + DIFF, W/ CRYSTALS
Eosinophils-Synovial: 0 %
Lymphocytes-Synovial Fld: 1 %
Monocyte-Macrophage-Synovial Fluid: 4 %
Neutrophil, Synovial: 95 %
WBC, Synovial: 4638 /mm3 — ABNORMAL HIGH (ref 0–200)

## 2023-04-27 LAB — BASIC METABOLIC PANEL
Anion gap: 10 (ref 5–15)
BUN: 14 mg/dL (ref 8–23)
CO2: 24 mmol/L (ref 22–32)
Calcium: 8.9 mg/dL (ref 8.9–10.3)
Chloride: 100 mmol/L (ref 98–111)
Creatinine, Ser: 1.28 mg/dL — ABNORMAL HIGH (ref 0.61–1.24)
GFR, Estimated: >60 mL/min (ref >60)
Glucose, Bld: 135 mg/dL — ABNORMAL HIGH (ref 70–99)
Potassium: 3.3 mmol/L — ABNORMAL LOW (ref 3.5–5.1)
Sodium: 134 mmol/L — ABNORMAL LOW (ref 135–145)

## 2023-04-27 MED ORDER — NAPROXEN 500 MG PO TABS: 500.0000 mg | ORAL_TABLET | Freq: Two times a day (BID) | ORAL | 0 refills | Status: AC

## 2023-04-27 MED ORDER — NAPROXEN 500 MG PO TABS
500.0000 mg | ORAL_TABLET | Freq: Once | ORAL | Status: AC
  Administered 2023-04-27: 500 mg via ORAL
  Filled 2023-04-27: qty 1

## 2023-04-27 MED ORDER — LIDOCAINE HCL (PF) 1 % IJ SOLN
10.0000 mL | Freq: Once | INTRAMUSCULAR | Status: DC
  Filled 2023-04-27: qty 10

## 2023-04-27 NOTE — Discharge Instructions (Addendum)
For gout take naproxen 500 mg twice daily for the next 7 days.  Thank you for choosing [place] for your health care today!  Please see your primary doctor this week for a follow up appointment.   If you have any new, worsening, or unexpected symptoms call your doctor right away or come back to the emergency department for reevaluation.  It was my pleasure to care for you today.   Daneil Dan Modesto Charon, MD

## 2023-04-27 NOTE — ED Provider Notes (Addendum)
[facility] Provider Note    Event Date/Time   First MD Initiated Contact with Patient 04/27/23 0201     (approximate)   History   Joint Swelling   HPI  Fernando Cole is a 67 y.o. 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  He had a joint effusion that was drained in the springtime, feels similar to then.  Swelling, pain, progressive over the last several days.  No new injuries.  No fevers or chills.  Thinks he has a history of gout as well.  He is able to range it, though at certain point of flexion he feels pain is increased, but then flexes beyond that point after which pain is relieved at full flexion.  Independent Historian contributed to assessment above: Family member is at bedside corroborate information given above.  External Medical Documents Reviewed: April 2024 orthopedics note where he had joint aspiration      Physical Exam   Triage Vital Signs: ED Triage Vitals  Encounter Vitals Group     BP 04/26/23 2347 (!) 146/87     Systolic BP Percentile --      Diastolic BP Percentile --      Pulse Rate 04/26/23 2347 (!) 111     Resp 04/26/23 2347 16     Temp 04/26/23 2347 99 F (37.2 C)     Temp src --      SpO2 04/26/23 2347 98 %     Weight --      Height --      Head Circumference --      Peak Flow --      Pain Score 04/26/23 2346 9     Pain Loc --      Pain Education --      Exclude from Growth Chart --     Most recent vital signs: Vitals:   04/26/23 2347  BP: (!) 146/87  Pulse: (!) 111  Resp: 16  Temp: 99 F (37.2 C)  SpO2: 98%    General: Awake, no distress.  CV:  Good peripheral perfusion.  Resp:  Normal effort.  Abd:  No distention.  Other:  Swelling and mild warmth to the right knee.  Neurovascular intact distally.  Able to range   ED Results / Procedures / Treatments    Labs (all labs ordered are listed, but only abnormal results are displayed) Labs Reviewed  CBC - Abnormal; Notable for the following components:      Result Value   RBC 4.01 (*)    HCT 37.9 (*)    All other components within normal limits  BASIC METABOLIC PANEL - Abnormal; Notable for the following components:   Sodium 134 (*)    Potassium 3.3 (*)    Glucose, Bld 135 (*)    Creatinine, Ser 1.28 (*)    All other components within normal limits  BODY FLUID CULTURE W GRAM STAIN  BODY FLUID CELL COUNT WITH DIFFERENTIAL     I ordered and reviewed the above labs they are notable for white blood cell count is normal.  RADIOLOGY I independently reviewed and interpreted x-ray of the knee and see no obvious fracture or dislocation I also reviewed radiologist's formal read.   PROCEDURES:  Critical Care performed: No  .Joint Aspiration/Arthrocentesis  Date/Time: 04/27/2023 3:36 AM  Performed by: Pilar Jarvis, MD Authorized by: Pilar Jarvis,  MD   Consent:    Consent obtained:  Verbal   Consent given by:  Patient   Risks, benefits, and alternatives were discussed: yes     Risks discussed:  Bleeding, infection, nerve damage, incomplete drainage, pain and poor cosmetic result   Alternatives discussed:  No treatment and delayed treatment Universal protocol:    Procedure explained and questions answered to patient or proxy's satisfaction: yes     Test results available: yes     Patient identity confirmed:  Verbally with patient Location:    Location:  Knee   Knee:  R knee Anesthesia:    Anesthesia method:  Local infiltration   Local anesthetic:  Lidocaine 1% w/o epi Procedure details:    Preparation: Patient was prepped and draped in usual sterile fashion     Needle gauge:  18 G   Ultrasound guidance: no     Approach:  Lateral   Aspirate amount:  75cc   Aspirate characteristics:  Yellow   Steroid injected: no     Specimen collected: yes   Post-procedure details:     Dressing:  Adhesive bandage   Procedure completion:  Tolerated    MEDICATIONS ORDERED IN ED: Medications  lidocaine (PF) (XYLOCAINE) 1 % injection 10 mL (has no administration in time range)    IMPRESSION / MDM / ASSESSMENT AND PLAN / ED COURSE  I reviewed the triage vital signs and the nursing notes.                                Patient's presentation is most consistent with acute presentation with potential threat to life or bodily function.  Differential diagnosis includes, but is not limited to, osteoarthritis, crystal arthropathy, septic joint   The patient is on the cardiac monitor to evaluate for evidence of arrhythmia and/or significant heart rate changes.  MDM:   Patient with recurrent joint swelling and pain most consistent with osteoarthritis or arthritis related or inflammatory changes less likely septic joint given no fever, ability to range.  Aspirated with immediate relief.  Specimen sent for lab analysis which shows crystals and inflammatory changes consistent with gout.  NSAIDs prescribed.  Patient to be discharged with orthopedics follow-up.       FINAL CLINICAL IMPRESSION(S) / ED DIAGNOSES   Final diagnoses:  Acute pain of right knee  Knee effusion, right     Rx / DC Orders   ED Discharge Orders     None        Note:  This document was prepared using Dragon voice recognition software and may include unintentional dictation errors.    Pilar Jarvis, MD 04/27/23 1610    Pilar Jarvis, MD 04/27/23 [phone]

## 2023-04-29 ENCOUNTER — Ambulatory Visit (INDEPENDENT_AMBULATORY_CARE_PROVIDER_SITE_OTHER): Payer: Medicare Other | Admitting: Internal Medicine

## 2023-04-29 VITALS — BP 129/82 | HR 84 | Ht 74.0 in | Wt 223.4 lb

## 2023-04-29 DIAGNOSIS — M109 Gout, unspecified: Secondary | ICD-10-CM | POA: Diagnosis not present

## 2023-04-29 MED ORDER — COLCHICINE 0.6 MG PO TABS: 0.6000 mg | ORAL_TABLET | Freq: Every day | ORAL | 1 refills | Status: DC

## 2023-04-29 MED ORDER — CELECOXIB 200 MG PO CAPS
200.0000 mg | ORAL_CAPSULE | Freq: Two times a day (BID) | ORAL | 0 refills | Status: DC | PRN
Start: 2023-04-29 — End: 2023-05-14

## 2023-04-29 NOTE — Progress Notes (Signed)
Established Patient Office Visit  Subjective:  Patient ID: Fernando Cole, male    DOB: [birth year]  Age: 67 y.o. MRN: [id number]  No chief complaint on file.   ER follow up Ardit Danh/p arthrocentesis which diagnosed Gout. Still c/o pain in his right knee as he wasn't prescribed colchicine.    No other concerns at this time.   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)    Gout    Heart murmur    Hypertension    PE (pulmonary thromboembolism) (HCC)     Past Surgical History:  Procedure Laterality Date   CATARACT EXTRACTION W/PHACO Left 06/18/2022   Procedure: CATARACT EXTRACTION PHACO AND INTRAOCULAR LENS PLACEMENT (IOC) LEFT eyhance toric;  Surgeon: Galen Manila, MD;  Location: [facility];  Service: Ophthalmology;  Laterality: Left;  4.32 00:26.2   CATARACT EXTRACTION W/PHACO Right 07/02/2022   Procedure: CATARACT EXTRACTION PHACO AND INTRAOCULAR LENS PLACEMENT (IOC) RIGHT Eyhance Toric 3.41 00:28.0;  Surgeon: Galen Manila, MD;  Location: [facility];  Service: Ophthalmology;  Laterality: Right;   THYROIDECTOMY  2013    Social History   Socioeconomic History   Marital status: Married    Spouse name: Not on file   Number of children: Not on file   Years of education: Not on file   Highest education level: Not on file  Occupational History   Not on file  Tobacco Use   Smoking status: Former    Current packs/day: 0.00    Types: Cigarettes    Quit date: 2013    Years since quitting: 11.6   Smokeless tobacco: Never  Vaping Use   Vaping status: Never Used  Substance and Sexual Activity   Alcohol use: Never   Drug use: Never   Sexual activity: Not on file  Other Topics Concern   Not on file  Social History Narrative   Not on file   Social Determinants of Health   Financial Resource Strain: Not on file  Food Insecurity: Not on file  Transportation Needs: Not on file  Physical Activity: Not on file   Stress: Not on file  Social Connections: Not on file  Intimate Partner Violence: Not on file    Family History  Problem Relation Age of Onset   Prolactinoma Neg Hx    Prostate cancer Neg Hx    Kidney cancer Neg Hx     No Known Allergies  Review of Systems  Constitutional: Negative.   HENT: Negative.    Eyes: Negative.   Respiratory: Negative.    Cardiovascular: Negative.   Gastrointestinal: Negative.   Genitourinary: Negative.   Musculoskeletal:  Positive for joint pain.  Skin: Negative.   Neurological: Negative.   Endo/Heme/Allergies: Negative.        Objective:   BP 129/82   Pulse 84   Ht 6\' 2"  (1.88 m)   Wt 223 lb 6.4 oz (101.3 kg)   SpO2 97%   BMI 28.68 kg/m   Vitals:   04/29/23 1408  BP: 129/82  Pulse: 84  Height: 6\' 2"  (1.88 m)  Weight: 223 lb 6.4 oz (101.3 kg)  SpO2: 97%  BMI (Calculated): 28.67    Physical Exam Vitals reviewed.  Constitutional:      Appearance: Normal appearance.  HENT:     Head: Normocephalic.     Left Ear: There is no impacted cerumen.     Nose: Nose normal.     Mouth/Throat:  Mouth: Mucous membranes are moist.     Pharynx: No posterior oropharyngeal erythema.  Eyes:     Extraocular Movements: Extraocular movements intact.     Pupils: Pupils are equal, round, and reactive to light.  Cardiovascular:     Rate and Rhythm: Regular rhythm.     Chest Wall: PMI is not displaced.     Pulses: Normal pulses.     Heart sounds: Normal heart sounds. No murmur heard.    Comments: With Ted hose in place. Pulmonary:     Effort: Pulmonary effort is normal.     Breath sounds: Normal air entry. No rhonchi or rales.  Abdominal:     General: Abdomen is flat. Bowel sounds are normal. There is no distension.     Palpations: Abdomen is soft. There is no hepatomegaly, splenomegaly or mass.     Tenderness: There is no abdominal tenderness.  Musculoskeletal:        General: Normal range of motion.     Cervical back: Normal range of  motion and neck supple.     Right knee: Swelling present. Tenderness (overlying the patella) present.     Right lower leg: 1+ Pitting Edema present.     Left lower leg: 1+ Pitting Edema present.  Skin:    General: Skin is warm and dry.  Neurological:     General: No focal deficit present.     Mental Status: He is alert and oriented to person, place, and time.     Cranial Nerves: No cranial nerve deficit.     Motor: No weakness.  Psychiatric:        Mood and Affect: Mood normal.        Behavior: Behavior normal.      No results found for any visits on 04/29/23.      Assessment & Plan:  As per problem list. Order steroids if fails to improve.  Problem List Items Addressed This Visit       Musculoskeletal and Integument   Acute gout of right knee - Primary   Relevant Medications   colchicine 0.6 MG tablet   celecoxib (CELEBREX) 200 MG capsule    Return if symptoms worsen or fail to improve.   Total time spent: 30 minutes  Luna Fuse, MD  04/29/2023   This document may have been prepared by [facility] Voice Recognition software and as such may include unintentional dictation errors.

## 2023-04-30 LAB — BODY FLUID CULTURE W GRAM STAIN: Culture: NO GROWTH

## 2023-05-02 ENCOUNTER — Telehealth: Payer: Self-pay

## 2023-05-02 NOTE — Telephone Encounter (Signed)
Transition Care Management Unsuccessful Follow-up Telephone Call  Date of discharge and from where:  04/27/2023 [facility]  Attempts:  1st Attempt  Reason for unsuccessful TCM follow-up call:  Left voice message  Fernando Cole [facility]  [facility] Population Health Community Resource Care Guide   [email or web address]  ?? [phone]   Website: [email or web address]  [email or web address]

## 2023-05-05 ENCOUNTER — Telehealth: Payer: Self-pay

## 2023-05-05 NOTE — Telephone Encounter (Signed)
Transition Care Management Unsuccessful Follow-up Telephone Call  Date of discharge and from where:  04/27/2023 [facility]  Attempts:  2nd Attempt  Reason for unsuccessful TCM follow-up call:  Left voice message  Sophie Tamez [facility]  [facility] Population Health Community Resource Care Guide   [email or web address]  ?? [phone]   Website: [email or web address]  [email or web address]

## 2023-05-09 ENCOUNTER — Other Ambulatory Visit: Payer: Medicare Other

## 2023-05-09 DIAGNOSIS — E782 Mixed hyperlipidemia: Secondary | ICD-10-CM

## 2023-05-09 DIAGNOSIS — E038 Other specified hypothyroidism: Secondary | ICD-10-CM

## 2023-05-10 LAB — COMPREHENSIVE METABOLIC PANEL
ALT: 26 IU/L (ref 0–44)
AST: 16 IU/L (ref 0–40)
Albumin: 4.4 g/dL (ref 3.9–4.9)
Alkaline Phosphatase: 62 IU/L (ref 44–121)
BUN/Creatinine Ratio: 9 — ABNORMAL LOW (ref 10–24)
BUN: 12 mg/dL (ref 8–27)
Bilirubin Total: 0.8 mg/dL (ref 0.0–1.2)
CO2: 25 mmol/L (ref 20–29)
Calcium: 9.3 mg/dL (ref 8.6–10.2)
Chloride: 102 mmol/L (ref 96–106)
Creatinine, Ser: 1.37 mg/dL — ABNORMAL HIGH (ref 0.76–1.27)
Globulin, Total: 3.7 g/dL (ref 1.5–4.5)
Glucose: 105 mg/dL — ABNORMAL HIGH (ref 70–99)
Potassium: 4.4 mmol/L (ref 3.5–5.2)
Sodium: 141 mmol/L (ref 134–144)
Total Protein: 8.1 g/dL (ref 6.0–8.5)
eGFR: 57 mL/min/{1.73_m2} — ABNORMAL LOW (ref >59)

## 2023-05-10 LAB — LIPID PANEL
Chol/HDL Ratio: 3 ratio (ref 0.0–5.0)
Cholesterol, Total: 128 mg/dL (ref 100–199)
HDL: 42 mg/dL (ref >39)
LDL Chol Calc (NIH): 70 mg/dL (ref 0–99)
Triglycerides: 84 mg/dL (ref 0–149)
VLDL Cholesterol Cal: 16 mg/dL (ref 5–40)

## 2023-05-10 LAB — TSH: TSH: 1.53 u[IU]/mL (ref 0.450–4.500)

## 2023-05-14 ENCOUNTER — Encounter: Payer: Self-pay | Admitting: Internal Medicine

## 2023-05-14 ENCOUNTER — Ambulatory Visit (INDEPENDENT_AMBULATORY_CARE_PROVIDER_SITE_OTHER): Payer: Medicare Other | Admitting: Internal Medicine

## 2023-05-14 VITALS — BP 118/63 | HR 80 | Ht 74.0 in | Wt 221.2 lb

## 2023-05-14 DIAGNOSIS — E039 Hypothyroidism, unspecified: Secondary | ICD-10-CM | POA: Diagnosis not present

## 2023-05-14 DIAGNOSIS — E782 Mixed hyperlipidemia: Secondary | ICD-10-CM | POA: Diagnosis not present

## 2023-05-14 DIAGNOSIS — I10 Essential (primary) hypertension: Secondary | ICD-10-CM | POA: Diagnosis not present

## 2023-05-14 DIAGNOSIS — Z1211 Encounter for screening for malignant neoplasm of colon: Secondary | ICD-10-CM

## 2023-05-14 MED ORDER — OLMESARTAN MEDOXOMIL-HCTZ 40-12.5 MG PO TABS
1.0000 | ORAL_TABLET | Freq: Every day | ORAL | 0 refills | Status: DC
Start: 2023-05-14 — End: 2023-08-13

## 2023-05-14 NOTE — Progress Notes (Signed)
Established Patient Office Visit  Subjective:  Patient ID: Fernando Cole, male    DOB: March 31, 1956  Age: 67 y.o. MRN: [id number]  Chief Complaint  Patient presents with   Follow-up    Right knee pain    No new complaints, here for lab review and medication refills. LDL and TC well controlled on lab review. Triglycerides also satisfactory and CMP notable for stable CKD while TSH also satisfactory. Gout flare has resolved.   No other concerns at this time.   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)    Gout    Heart murmur    Hypertension    PE (pulmonary thromboembolism) (HCC)     Past Surgical History:  Procedure Laterality Date   CATARACT EXTRACTION W/PHACO Left 06/18/2022   Procedure: CATARACT EXTRACTION PHACO AND INTRAOCULAR LENS PLACEMENT (IOC) LEFT eyhance toric;  Surgeon: Galen Manila, MD;  Location: [facility];  Service: Ophthalmology;  Laterality: Left;  4.32 00:26.2   CATARACT EXTRACTION W/PHACO Right 07/02/2022   Procedure: CATARACT EXTRACTION PHACO AND INTRAOCULAR LENS PLACEMENT (IOC) RIGHT Eyhance Toric 3.41 00:28.0;  Surgeon: Galen Manila, MD;  Location: [facility];  Service: Ophthalmology;  Laterality: Right;   THYROIDECTOMY  2013    Social History   Socioeconomic History   Marital status: Married    Spouse name: Not on file   Number of children: Not on file   Years of education: Not on file   Highest education level: Not on file  Occupational History   Not on file  Tobacco Use   Smoking status: Former    Current packs/day: 0.00    Types: Cigarettes    Quit date: 2013    Years since quitting: 11.6   Smokeless tobacco: Never  Vaping Use   Vaping status: Never Used  Substance and Sexual Activity   Alcohol use: Never   Drug use: Never   Sexual activity: Not on file  Other Topics Concern   Not on file  Social History Narrative   Not on file   Social Determinants of Health    Financial Resource Strain: Not on file  Food Insecurity: Not on file  Transportation Needs: Not on file  Physical Activity: Not on file  Stress: Not on file  Social Connections: Not on file  Intimate Partner Violence: Not on file    Family History  Problem Relation Age of Onset   Prolactinoma Neg Hx    Prostate cancer Neg Hx    Kidney cancer Neg Hx     No Known Allergies  Review of Systems  Constitutional: Negative.   HENT: Negative.    Eyes: Negative.   Respiratory: Negative.    Cardiovascular: Negative.   Gastrointestinal: Negative.   Genitourinary: Negative.   Musculoskeletal:  Positive for joint pain (chronic right knee).  Skin: Negative.   Neurological: Negative.   Endo/Heme/Allergies: Negative.        Objective:   BP 118/63   Pulse 80   Ht 6\' 2"  (1.88 m)   Wt 221 lb 3.2 oz (100.3 kg)   SpO2 98%   BMI 28.40 kg/m   Vitals:   05/14/23 0913  BP: 118/63  Pulse: 80  Height: 6\' 2"  (1.88 m)  Weight: 221 lb 3.2 oz (100.3 kg)  SpO2: 98%  BMI (Calculated): 28.39    Physical Exam Vitals reviewed.  Constitutional:      Appearance: Normal appearance.  HENT:     Head:  Normocephalic.     Left Ear: There is no impacted cerumen.     Nose: Nose normal.     Mouth/Throat:     Mouth: Mucous membranes are moist.     Pharynx: No posterior oropharyngeal erythema.  Eyes:     Extraocular Movements: Extraocular movements intact.     Pupils: Pupils are equal, round, and reactive to light.  Cardiovascular:     Rate and Rhythm: Regular rhythm.     Chest Wall: PMI is not displaced.     Pulses: Normal pulses.     Heart sounds: Normal heart sounds. No murmur heard.    Comments: With Ted hose in place. Pulmonary:     Effort: Pulmonary effort is normal.     Breath sounds: Normal air entry. No rhonchi or rales.  Abdominal:     General: Abdomen is flat. Bowel sounds are normal. There is no distension.     Palpations: Abdomen is soft. There is no hepatomegaly,  splenomegaly or mass.     Tenderness: There is no abdominal tenderness.  Musculoskeletal:        General: Normal range of motion.     Cervical back: Normal range of motion and neck supple.     Right knee: No swelling. Tenderness (overlying the patella) present.     Right lower leg: 1+ Pitting Edema present.     Left lower leg: 1+ Pitting Edema present.     Comments: Small right knee joint effusion  Skin:    General: Skin is warm and dry.  Neurological:     General: No focal deficit present.     Mental Status: He is alert and oriented to person, place, and time.     Cranial Nerves: No cranial nerve deficit.     Motor: No weakness.  Psychiatric:        Mood and Affect: Mood normal.        Behavior: Behavior normal.      No results found for any visits on 05/14/23.  Recent Results (from the past 2160 hour(Keyosha Tiedt))  PSA Total (Reflex To Free)     Status: Abnormal   Collection Time: 03/07/23  1:45 PM  Result Value Ref Range   Prostate Specific Ag, Serum 12.7 (H) 0.0 - 4.0 ng/mL    Comment: Roche ECLIA methodology. According to the American Urological Association, Serum PSA should decrease and remain at undetectable levels after radical prostatectomy. The AUA defines biochemical recurrence as an initial PSA value 0.2 ng/mL or greater followed by a subsequent confirmatory PSA value 0.2 ng/mL or greater. Values obtained with different assay methods or kits cannot be used interchangeably. Results cannot be interpreted as absolute evidence of the presence or absence of malignant disease.    Reflex Criteria Comment     Comment: The percent free PSA is performed on a reflex basis only when the total PSA is between 4.0 and 10.0 ng/mL.   CBC     Status: Abnormal   Collection Time: 04/26/23 11:49 PM  Result Value Ref Range   WBC 10.1 4.0 - 10.5 K/uL   RBC 4.01 (L) 4.22 - 5.81 MIL/uL   Hemoglobin 13.3 13.0 - 17.0 g/dL   HCT 84.6 (L) 96.2 - 95.2 %   MCV 94.5 80.0 - 100.0 fL   MCH 33.2  26.0 - 34.0 pg   MCHC 35.1 30.0 - 36.0 g/dL   RDW 84.1 32.4 - 40.1 %   Platelets 244 150 - 400 K/uL   nRBC 0.0  0.0 - 0.2 %    Comment: Performed at [facility], [address] [place], [place] [address]  Basic metabolic panel     Status: Abnormal   Collection Time: 04/26/23 11:49 PM  Result Value Ref Range   Sodium 134 (L) 135 - 145 mmol/L   Potassium 3.3 (L) 3.5 - 5.1 mmol/L   Chloride 100 98 - 111 mmol/L   CO2 24 22 - 32 mmol/L   Glucose, Bld 135 (H) 70 - 99 mg/dL    Comment: Glucose reference range applies only to samples taken after fasting for at least 8 hours.   BUN 14 8 - 23 mg/dL   Creatinine, Ser 1.47 (H) 0.61 - 1.24 mg/dL   Calcium 8.9 8.9 - 82.9 mg/dL   GFR, Estimated >56 >21 mL/min    Comment: (NOTE) Calculated using the CKD-EPI Creatinine Equation (2021)    Anion gap 10 5 - 15    Comment: Performed at [facility], [address] [place], [place] [address]  Body fluid culture w Gram Stain     Status: None   Collection Time: 04/27/23  3:02 AM   Specimen: KNEE; Body Fluid  Result Value Ref Range   Specimen Description      KNEE RIGHT Performed at [facility], [address] [place], [place] [address]    Special Requests      NONE Performed at [facility], [address] [place], [place] [address]    Gram Stain      ABUNDANT WBC PRESENT, PREDOMINANTLY PMN NO ORGANISMS SEEN    Culture      NO GROWTH 3 DAYS Performed at [facility], [address], [place], [place] [address]    Report Status 04/30/2023 FINAL   Synovial cell count + diff, w/ crystals     Status: Abnormal   Collection Time: 04/27/23  3:02 AM  Result Value Ref Range   Color, Synovial YELLOW YELLOW   Appearance-Synovial CLOUDY (A) CLEAR   Crystals, Fluid INTRACELLULAR MONOSODIUM URATE CRYSTALS     Comment: EXTRACELLULAR MONOSODIUM URATE CRYSTALS   WBC, Synovial 4,638 (H) 0 - 200 /cu mm   Neutrophil, Synovial 95 %   Lymphocytes-Synovial Fld  1 %   Monocyte-Macrophage-Synovial Fluid 4 %   Eosinophils-Synovial 0 %    Comment: Performed at [facility], [address] [place], [place] [address]  TSH     Status: None   Collection Time: 05/09/23 11:30 AM  Result Value Ref Range   TSH 1.530 0.450 - 4.500 uIU/mL  Lipid panel     Status: None   Collection Time: 05/09/23 11:30 AM  Result Value Ref Range   Cholesterol, Total 128 100 - 199 mg/dL   Triglycerides 84 0 - 149 mg/dL   HDL 42 >27 mg/dL   VLDL Cholesterol Cal 16 5 - 40 mg/dL   LDL Chol Calc (NIH) 70 0 - 99 mg/dL   Chol/HDL Ratio 3.0 0.0 - 5.0 ratio    Comment:                                   T. Chol/HDL Ratio                                             Men  Women                               1/2 Avg.Risk  3.4    3.3                                   Avg.Risk  5.0    4.4                                2X Avg.Risk  9.6    7.1                                3X Avg.Risk 23.4   11.0   Comprehensive metabolic panel     Status: Abnormal   Collection Time: 05/09/23 11:30 AM  Result Value Ref Range   Glucose 105 (H) 70 - 99 mg/dL   BUN 12 8 - 27 mg/dL   Creatinine, Ser 1.61 (H) 0.76 - 1.27 mg/dL   eGFR 57 (L) >09 UE/AVW/0.98   BUN/Creatinine Ratio 9 (L) 10 - 24   Sodium 141 134 - 144 mmol/L   Potassium 4.4 3.5 - 5.2 mmol/L   Chloride 102 96 - 106 mmol/L   CO2 25 20 - 29 mmol/L   Calcium 9.3 8.6 - 10.2 mg/dL   Total Protein 8.1 6.0 - 8.5 g/dL   Albumin 4.4 3.9 - 4.9 g/dL   Globulin, Total 3.7 1.5 - 4.5 g/dL   Bilirubin Total 0.8 0.0 - 1.2 mg/dL   Alkaline Phosphatase 62 44 - 121 IU/L   AST 16 0 - 40 IU/L   ALT 26 0 - 44 IU/L      Assessment & Plan:  As per problem list. The current medical regimen is effective;  continue present plan and medications.   Problem List Items Addressed This Visit       Cardiovascular and Mediastinum   Primary hypertension - Primary   Relevant Medications   olmesartan-hydrochlorothiazide (BENICAR HCT) 40-12.5 MG  tablet     Endocrine   Hypothyroidism (acquired)   Relevant Orders   TSH     Other   Mixed hyperlipidemia   Relevant Medications   olmesartan-hydrochlorothiazide (BENICAR HCT) 40-12.5 MG tablet   Other Relevant Orders   Lipid panel   CK   Other Visit Diagnoses     Colon cancer screening       Relevant Orders   Ambulatory referral to Gastroenterology       Return in about 3 months (around 08/13/2023) for fu with labs prior.   Total time spent: 20 minutes  Luna Fuse, MD  05/14/2023   This document may have been prepared by [facility] Voice Recognition software and as such may include unintentional dictation errors.

## 2023-06-09 ENCOUNTER — Other Ambulatory Visit: Payer: Self-pay | Admitting: Internal Medicine

## 2023-07-03 ENCOUNTER — Other Ambulatory Visit: Payer: Self-pay | Admitting: Internal Medicine

## 2023-07-26 ENCOUNTER — Other Ambulatory Visit: Payer: Self-pay | Admitting: Internal Medicine

## 2023-08-08 ENCOUNTER — Other Ambulatory Visit: Payer: Self-pay

## 2023-08-08 DIAGNOSIS — E782 Mixed hyperlipidemia: Secondary | ICD-10-CM

## 2023-08-08 DIAGNOSIS — E039 Hypothyroidism, unspecified: Secondary | ICD-10-CM

## 2023-08-11 ENCOUNTER — Other Ambulatory Visit: Payer: Medicare Other

## 2023-08-12 LAB — COMPREHENSIVE METABOLIC PANEL
ALT: 34 [IU]/L (ref 0–44)
AST: 17 [IU]/L (ref 0–40)
Albumin: 4.4 g/dL (ref 3.9–4.9)
Alkaline Phosphatase: 65 [IU]/L (ref 44–121)
BUN/Creatinine Ratio: 11 (ref 10–24)
BUN: 16 mg/dL (ref 8–27)
Bilirubin Total: 0.7 mg/dL (ref 0.0–1.2)
CO2: 27 mmol/L (ref 20–29)
Calcium: 9.1 mg/dL (ref 8.6–10.2)
Chloride: 101 mmol/L (ref 96–106)
Creatinine, Ser: 1.42 mg/dL — ABNORMAL HIGH (ref 0.76–1.27)
Globulin, Total: 3.1 g/dL (ref 1.5–4.5)
Glucose: 108 mg/dL — ABNORMAL HIGH (ref 70–99)
Potassium: 4.6 mmol/L (ref 3.5–5.2)
Sodium: 141 mmol/L (ref 134–144)
Total Protein: 7.5 g/dL (ref 6.0–8.5)
eGFR: 54 mL/min/{1.73_m2} — ABNORMAL LOW (ref >59)

## 2023-08-12 LAB — TSH: TSH: 0.72 u[IU]/mL (ref 0.450–4.500)

## 2023-08-12 LAB — LIPID PANEL
Chol/HDL Ratio: 3.2 {ratio} (ref 0.0–5.0)
Cholesterol, Total: 138 mg/dL (ref 100–199)
HDL: 43 mg/dL (ref >39)
LDL Chol Calc (NIH): 79 mg/dL (ref 0–99)
Triglycerides: 80 mg/dL (ref 0–149)
VLDL Cholesterol Cal: 16 mg/dL (ref 5–40)

## 2023-08-12 LAB — CK: Total CK: 220 U/L (ref 41–331)

## 2023-08-13 ENCOUNTER — Ambulatory Visit (INDEPENDENT_AMBULATORY_CARE_PROVIDER_SITE_OTHER): Payer: Medicare Other | Admitting: Internal Medicine

## 2023-08-13 ENCOUNTER — Encounter: Payer: Self-pay | Admitting: Internal Medicine

## 2023-08-13 VITALS — BP 132/81 | HR 76 | Ht 74.0 in | Wt 226.8 lb

## 2023-08-13 DIAGNOSIS — M109 Gout, unspecified: Secondary | ICD-10-CM

## 2023-08-13 DIAGNOSIS — E782 Mixed hyperlipidemia: Secondary | ICD-10-CM

## 2023-08-13 DIAGNOSIS — I10 Essential (primary) hypertension: Secondary | ICD-10-CM

## 2023-08-13 DIAGNOSIS — R7303 Prediabetes: Secondary | ICD-10-CM

## 2023-08-13 DIAGNOSIS — E038 Other specified hypothyroidism: Secondary | ICD-10-CM

## 2023-08-13 MED ORDER — OLMESARTAN MEDOXOMIL-HCTZ 40-12.5 MG PO TABS
1.0000 | ORAL_TABLET | Freq: Every day | ORAL | 0 refills | Status: DC
Start: 2023-08-13 — End: 2023-11-17

## 2023-08-13 MED ORDER — COLCHICINE 0.6 MG PO TABS
0.6000 mg | ORAL_TABLET | Freq: Every day | ORAL | 1 refills | Status: AC
Start: 2023-08-13 — End: ?

## 2023-08-13 NOTE — Progress Notes (Signed)
Established Patient Office Visit  Subjective:  Patient ID: Fernando Cole, male    DOB: 17-Mar-1956  Age: 67 y.o. MRN: [id number]  Chief Complaint  Patient presents with   Follow-up    No new complaints, here for lab review and medication refills. Gained weight. LDL and TC well controlled on lab review. Triglycerides also satisfactory. TSH normal with stable renal function on lab review.    No other concerns at this time.   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)    Gout    Heart murmur    Hypertension    PE (pulmonary thromboembolism) (HCC)     Past Surgical History:  Procedure Laterality Date   CATARACT EXTRACTION W/PHACO Left 06/18/2022   Procedure: CATARACT EXTRACTION PHACO AND INTRAOCULAR LENS PLACEMENT (IOC) LEFT eyhance toric;  Surgeon: Galen Manila, MD;  Location: [facility];  Service: Ophthalmology;  Laterality: Left;  4.32 00:26.2   CATARACT EXTRACTION W/PHACO Right 07/02/2022   Procedure: CATARACT EXTRACTION PHACO AND INTRAOCULAR LENS PLACEMENT (IOC) RIGHT Eyhance Toric 3.41 00:28.0;  Surgeon: Galen Manila, MD;  Location: [facility];  Service: Ophthalmology;  Laterality: Right;   THYROIDECTOMY  2013    Social History   Socioeconomic History   Marital status: Married    Spouse name: Not on file   Number of children: Not on file   Years of education: Not on file   Highest education level: Not on file  Occupational History   Not on file  Tobacco Use   Smoking status: Former    Current packs/day: 0.00    Types: Cigarettes    Quit date: 2013    Years since quitting: 11.9   Smokeless tobacco: Never  Vaping Use   Vaping status: Never Used  Substance and Sexual Activity   Alcohol use: Never   Drug use: Never   Sexual activity: Not on file  Other Topics Concern   Not on file  Social History Narrative   Not on file   Social Determinants of Health   Financial Resource Strain: Not  on file  Food Insecurity: Not on file  Transportation Needs: Not on file  Physical Activity: Not on file  Stress: Not on file  Social Connections: Not on file  Intimate Partner Violence: Not on file    Family History  Problem Relation Age of Onset   Prolactinoma Neg Hx    Prostate cancer Neg Hx    Kidney cancer Neg Hx     No Known Allergies  Outpatient Medications Prior to Visit  Medication Sig   folic acid (FOLVITE) 1 MG tablet TAKE ONE TABLET (1 MG) BY MOUTH EVERY DAY   folic acid (FOLVITE) 800 MCG tablet Take 400 mcg by mouth daily.   hydrALAZINE (APRESOLINE) 25 MG tablet TAKE ONE TABLET (25 MG) BY MOUTH TWICE DAILY   levothyroxine (SYNTHROID) 100 MCG tablet Take 1 tablet (100 mcg total) by mouth daily before breakfast.   magnesium oxide (MAG-OX) 400 (240 Mg) MG tablet Take 400 mg by mouth daily.   prednisoLONE acetate (PRED FORTE) 1 % ophthalmic suspension Place 1 drop into both eyes 4 (four) times daily.   rivaroxaban (XARELTO) 10 MG TABS tablet Take 1 tablet (10 mg total) by mouth daily.   rosuvastatin (CRESTOR) 5 MG tablet TAKE ONE TABLET BY MOUTH AT BEDTIME   tadalafil (CIALIS) 5 MG tablet Take 1-2 tablets (5-10 mg total) by mouth daily as needed for erectile dysfunction.  tamsulosin (FLOMAX) 0.4 MG CAPS capsule TAKE 1 CAPSULE BY MOUTH ONCE DAILY   VITAMIN D PO Take 2,000 Units by mouth daily.   Zinc Citrate-Phytase (ZYTAZE) 25-500 MG CAPS Take 25-500 mg by mouth daily.   [DISCONTINUED] colchicine 0.6 MG tablet Take 1 tablet (0.6 mg total) by mouth daily. Take 1st pill today then another pill 2 hrs after, then daily from the next day until pain free.   [DISCONTINUED] olmesartan-hydrochlorothiazide (BENICAR HCT) 40-12.5 MG tablet Take 1 tablet by mouth daily.   No facility-administered medications prior to visit.    Review of Systems  Constitutional: Negative.   HENT: Negative.    Eyes: Negative.   Respiratory: Negative.    Cardiovascular: Negative.    Gastrointestinal: Negative.   Genitourinary: Negative.   Musculoskeletal:  Positive for joint pain (chronic right knee).  Skin: Negative.   Neurological: Negative.   Endo/Heme/Allergies: Negative.        Objective:   BP 132/81   Pulse 76   Ht 6\' 2"  (1.88 m)   Wt 226 lb 12.8 oz (102.9 kg)   SpO2 97%   BMI 29.12 kg/m   Vitals:   08/13/23 0930  BP: 132/81  Pulse: 76  Height: 6\' 2"  (1.88 m)  Weight: 226 lb 12.8 oz (102.9 kg)  SpO2: 97%  BMI (Calculated): 29.11    Physical Exam Vitals reviewed.  Constitutional:      Appearance: Normal appearance.  HENT:     Head: Normocephalic.     Left Ear: There is no impacted cerumen.     Nose: Nose normal.     Mouth/Throat:     Mouth: Mucous membranes are moist.     Pharynx: No posterior oropharyngeal erythema.  Eyes:     Extraocular Movements: Extraocular movements intact.     Pupils: Pupils are equal, round, and reactive to light.  Cardiovascular:     Rate and Rhythm: Regular rhythm.     Chest Wall: PMI is not displaced.     Pulses: Normal pulses.     Heart sounds: Normal heart sounds. No murmur heard.    Comments: With Ted hose in place. Pulmonary:     Effort: Pulmonary effort is normal.     Breath sounds: Normal air entry. No rhonchi or rales.  Abdominal:     General: Abdomen is flat. Bowel sounds are normal. There is no distension.     Palpations: Abdomen is soft. There is no hepatomegaly, splenomegaly or mass.     Tenderness: There is no abdominal tenderness.  Musculoskeletal:        General: Normal range of motion.     Cervical back: Normal range of motion and neck supple.     Right knee: No swelling. Tenderness (overlying the patella) present.     Right lower leg: 1+ Pitting Edema present.     Left lower leg: 1+ Pitting Edema present.     Comments: Small right knee joint effusion  Skin:    General: Skin is warm and dry.  Neurological:     General: No focal deficit present.     Mental Status: He is alert and  oriented to person, place, and time.     Cranial Nerves: No cranial nerve deficit.     Motor: No weakness.  Psychiatric:        Mood and Affect: Mood normal.        Behavior: Behavior normal.      No results found for any visits on 08/13/23.  Recent Results (from the past 2160 hour(Mylisa Brunson))  CK     Status: None   Collection Time: 08/11/23 11:48 AM  Result Value Ref Range   Total CK 220 41 - 331 U/L  Lipid panel     Status: None   Collection Time: 08/11/23 11:48 AM  Result Value Ref Range   Cholesterol, Total 138 100 - 199 mg/dL   Triglycerides 80 0 - 149 mg/dL   HDL 43 >44 mg/dL   VLDL Cholesterol Cal 16 5 - 40 mg/dL   LDL Chol Calc (NIH) 79 0 - 99 mg/dL   Chol/HDL Ratio 3.2 0.0 - 5.0 ratio    Comment:                                   T. Chol/HDL Ratio                                             Men  Women                               1/2 Avg.Risk  3.4    3.3                                   Avg.Risk  5.0    4.4                                2X Avg.Risk  9.6    7.1                                3X Avg.Risk 23.4   11.0   TSH     Status: None   Collection Time: 08/11/23 11:48 AM  Result Value Ref Range   TSH 0.720 0.450 - 4.500 uIU/mL  Comprehensive metabolic panel     Status: Abnormal   Collection Time: 08/11/23 11:52 AM  Result Value Ref Range   Glucose 108 (H) 70 - 99 mg/dL   BUN 16 8 - 27 mg/dL   Creatinine, Ser 0.34 (H) 0.76 - 1.27 mg/dL   eGFR 54 (L) >74 QV/ZDG/3.87   BUN/Creatinine Ratio 11 10 - 24   Sodium 141 134 - 144 mmol/L   Potassium 4.6 3.5 - 5.2 mmol/L   Chloride 101 96 - 106 mmol/L   CO2 27 20 - 29 mmol/L   Calcium 9.1 8.6 - 10.2 mg/dL   Total Protein 7.5 6.0 - 8.5 g/dL   Albumin 4.4 3.9 - 4.9 g/dL   Globulin, Total 3.1 1.5 - 4.5 g/dL   Bilirubin Total 0.7 0.0 - 1.2 mg/dL   Alkaline Phosphatase 65 44 - 121 IU/L   AST 17 0 - 40 IU/L   ALT 34 0 - 44 IU/L      Assessment & Plan:  As per problem list.Stricter low calorie diet, low cholesterol and  low fat diet and exercise as much as possible. Problem List Items Addressed This Visit       Cardiovascular and Mediastinum   Primary hypertension   Relevant Medications  olmesartan-hydrochlorothiazide (BENICAR HCT) 40-12.5 MG tablet     Musculoskeletal and Integument   Acute gout of right knee   Relevant Medications   colchicine 0.6 MG tablet     Other   Mixed hyperlipidemia   Relevant Medications   olmesartan-hydrochlorothiazide (BENICAR HCT) 40-12.5 MG tablet   Prediabetes - Primary   Relevant Orders   Hemoglobin A1c   Other Visit Diagnoses     Other specified hypothyroidism           Return in about 3 months (around 11/11/2023) for fu with labs prior.   Total time spent: 20 minutes  Luna Fuse, MD  08/13/2023   This document may have been prepared by [facility] Voice Recognition software and as such may include unintentional dictation errors.

## 2023-10-06 ENCOUNTER — Telehealth: Payer: Self-pay | Admitting: Urology

## 2023-10-06 DIAGNOSIS — R972 Elevated prostate specific antigen [PSA]: Secondary | ICD-10-CM

## 2023-10-06 NOTE — Telephone Encounter (Signed)
Pt dropped in to ask about f/u appt regarding last lab visit on 03/07/23. A note was documented where you spoke with him but the pt does not recall. He is asking what he needs to do at this point. Does he need another Lab or another visit? Please advise patient.

## 2023-10-07 NOTE — Telephone Encounter (Signed)
Please call pt and schedule an office visit with lab visit for PSA prior as it has been over a year since we have seen him, we need to get labs and have him see the provider for next steps.

## 2023-10-15 ENCOUNTER — Other Ambulatory Visit: Payer: Medicare Other

## 2023-10-15 DIAGNOSIS — R972 Elevated prostate specific antigen [PSA]: Secondary | ICD-10-CM

## 2023-10-16 LAB — PSA: Prostate Specific Ag, Serum: 11.9 ng/mL — ABNORMAL HIGH (ref 0.0–4.0)

## 2023-10-22 ENCOUNTER — Ambulatory Visit: Payer: Medicare Other | Admitting: Urology

## 2023-11-05 ENCOUNTER — Encounter: Payer: Self-pay | Admitting: Urology

## 2023-11-05 ENCOUNTER — Ambulatory Visit (INDEPENDENT_AMBULATORY_CARE_PROVIDER_SITE_OTHER): Payer: PRIVATE HEALTH INSURANCE | Admitting: Urology

## 2023-11-05 VITALS — BP 145/89 | Ht 74.0 in | Wt 227.0 lb

## 2023-11-05 DIAGNOSIS — R972 Elevated prostate specific antigen [PSA]: Secondary | ICD-10-CM

## 2023-11-05 DIAGNOSIS — N138 Other obstructive and reflux uropathy: Secondary | ICD-10-CM

## 2023-11-05 DIAGNOSIS — N401 Enlarged prostate with lower urinary tract symptoms: Secondary | ICD-10-CM

## 2023-11-05 DIAGNOSIS — N529 Male erectile dysfunction, unspecified: Secondary | ICD-10-CM | POA: Diagnosis not present

## 2023-11-05 MED ORDER — TADALAFIL 5 MG PO TABS: 5.0000 mg | ORAL_TABLET | Freq: Every day | ORAL | 8 refills | Status: AC | PRN

## 2023-11-05 MED ORDER — TAMSULOSIN HCL 0.4 MG PO CAPS: 0.4000 mg | ORAL_CAPSULE | Freq: Every day | ORAL | 11 refills | Status: AC

## 2023-11-05 NOTE — Patient Instructions (Signed)

## 2023-11-05 NOTE — Progress Notes (Signed)
   11/05/2023 4:13 PM   Fernando Cole 1955/10/14 [id number]  Reason for visit: Follow up elevated PSA, urinary symptoms, ED  HPI: 68 year-old African-American male from [place] with history of DVT/PE on Xarelto and long history of elevated PSA and extensive work-up.  To briefly summarize, he underwent a negative prostate biopsy in 2017 for an elevated PSA of 5, underwent a 4K score with a 4% risk of finding a clinically significant prostate cancer, as well as a negative prostate MRI in 2018 that showed no suspicious lesions.  Percentage free PSA has always been reassuring at >20%.  Prostate volume was 72 cc on prostate MRI.  PSA density is <0.15.  He has a history of isolated significantly elevated PSA values of 20-25 when the PSA was drawn within a few days of ejaculations, and these have all dropped down on repeat testing.  Urinalysis at our last visit was completely benign, and PVR was normal.  DRE has been benign   PSA remains elevated but stable including most recently 11.9 from February 2025, 12.14 February 2023, 11.12 August 2022, 9.13 August 2021.   We again reviewed the AUA guidelines regarding PSA screening, as well as possible causes of false elevation including BPH, inflammation, recent sexual activity.  Overall has had a very reassuring work-up with low PSA density, negative biopsy, 4K score with only 4% chance of clinically significant prostate cancer, elevated percentage free PSA, and negative prostate MRI.  He also has problems with erectile dysfunction, currently using Cialis 5 to 10 mg on demand with good results.  This was refilled.  He also reports new problems with frequency and weak stream, nocturia 5-6 times at night.  He was previously prescribed Flomax by PCP but he never tried that medication.  I recommended starting Flomax and returning in 6 weeks for repeat urine testing, IPSS, and PVR.  If no improvement on the Flomax would recommend cystoscopy/TRUS for further  evaluation.  Cialis refilled Trial of Flomax for urinary symptoms/nocturia Can continue PSA monitoring yearly RTC 6 weeks UA, IPSS, PVR     Sondra Come, MD  [facility] [address], [address] [place], [place] [address] [phone]

## 2023-11-07 ENCOUNTER — Other Ambulatory Visit: Payer: Medicare Other

## 2023-11-07 DIAGNOSIS — R7303 Prediabetes: Secondary | ICD-10-CM

## 2023-11-08 LAB — COMPREHENSIVE METABOLIC PANEL
ALT: 25 IU/L (ref 0–44)
AST: 16 IU/L (ref 0–40)
Albumin: 4.3 g/dL (ref 3.9–4.9)
Alkaline Phosphatase: 68 IU/L (ref 44–121)
BUN/Creatinine Ratio: 12 (ref 10–24)
BUN: 15 mg/dL (ref 8–27)
Bilirubin Total: 1.1 mg/dL (ref 0.0–1.2)
CO2: 25 mmol/L (ref 20–29)
Calcium: 9.5 mg/dL (ref 8.6–10.2)
Chloride: 105 mmol/L (ref 96–106)
Creatinine, Ser: 1.28 mg/dL — ABNORMAL HIGH (ref 0.76–1.27)
Globulin, Total: 3.7 g/dL (ref 1.5–4.5)
Glucose: 116 mg/dL — ABNORMAL HIGH (ref 70–99)
Potassium: 4.3 mmol/L (ref 3.5–5.2)
Sodium: 144 mmol/L (ref 134–144)
Total Protein: 8 g/dL (ref 6.0–8.5)
eGFR: 61 mL/min/{1.73_m2} (ref >59)

## 2023-11-08 LAB — HEMOGLOBIN A1C
Est. average glucose Bld gHb Est-mCnc: 126 mg/dL
Hgb A1c MFr Bld: 6 % — ABNORMAL HIGH (ref 4.8–5.6)

## 2023-11-08 LAB — TSH: TSH: 0.936 u[IU]/mL (ref 0.450–4.500)

## 2023-11-10 ENCOUNTER — Other Ambulatory Visit: Payer: Self-pay | Admitting: Internal Medicine

## 2023-11-11 ENCOUNTER — Ambulatory Visit: Payer: Medicare Other | Admitting: Internal Medicine

## 2023-11-13 ENCOUNTER — Telehealth: Payer: Self-pay

## 2023-11-13 NOTE — Telephone Encounter (Signed)
 Patient LM asking for his lab results

## 2023-11-14 ENCOUNTER — Other Ambulatory Visit

## 2023-11-15 LAB — HEMOGLOBIN A1C
Est. average glucose Bld gHb Est-mCnc: 117 mg/dL
Hgb A1c MFr Bld: 5.7 % — ABNORMAL HIGH (ref 4.8–5.6)

## 2023-11-17 ENCOUNTER — Encounter: Payer: Self-pay | Admitting: Internal Medicine

## 2023-11-17 ENCOUNTER — Ambulatory Visit (INDEPENDENT_AMBULATORY_CARE_PROVIDER_SITE_OTHER): Admitting: Internal Medicine

## 2023-11-17 VITALS — BP 122/76 | HR 85 | Temp 98.0°F | Ht 74.0 in | Wt 224.0 lb

## 2023-11-17 DIAGNOSIS — E038 Other specified hypothyroidism: Secondary | ICD-10-CM | POA: Diagnosis not present

## 2023-11-17 DIAGNOSIS — R7303 Prediabetes: Secondary | ICD-10-CM

## 2023-11-17 DIAGNOSIS — I872 Venous insufficiency (chronic) (peripheral): Secondary | ICD-10-CM

## 2023-11-17 DIAGNOSIS — E782 Mixed hyperlipidemia: Secondary | ICD-10-CM

## 2023-11-17 DIAGNOSIS — I10 Essential (primary) hypertension: Secondary | ICD-10-CM

## 2023-11-17 MED ORDER — FUROSEMIDE 20 MG PO TABS: 20.0000 mg | ORAL_TABLET | Freq: Every day | ORAL | 11 refills | Status: AC

## 2023-11-17 MED ORDER — LEVOTHYROXINE SODIUM 100 MCG PO TABS: 100.0000 ug | ORAL_TABLET | Freq: Every day | ORAL | 3 refills | Status: DC

## 2023-11-17 MED ORDER — OLMESARTAN MEDOXOMIL-HCTZ 40-12.5 MG PO TABS
1.0000 | ORAL_TABLET | Freq: Every day | ORAL | 0 refills | Status: DC
Start: 2023-11-17 — End: 2023-12-09

## 2023-11-17 NOTE — Progress Notes (Signed)
 Established Patient Office Visit  Subjective:  Patient ID: Fernando Cole, male    DOB: May 05, 1956  Age: 68 y.o. MRN: [id number]  Chief Complaint  Patient presents with   Follow-up    Follow up with labs results Medication refills    C/o increasing ankle edema and also here for lab review and medication refills. A1c remains in the prediabetic range and TSH is satisfactory.     No other concerns at this time.   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41 00:28.0;  Surgeon: Galen Manila, MD;  Location: [facility];  Service: Ophthalmology;  Laterality: Right;   THYROIDECTOMY  2013    Social History   Socioeconomic History   Marital status: Married    Spouse name: Not on file   Number of children: Not on file   Years of education: Not on file   Highest education level: Not on file  Occupational History   Not on file  Tobacco Use   Smoking status: Former    Current packs/day: 0.00    Types: Cigarettes    Quit date: 2013    Years since quitting: 12.1   Smokeless tobacco: Never  Vaping Use   Vaping status: Never Used  Substance and Sexual Activity   Alcohol use: Never   Drug use: Never   Sexual activity: Not on file  Other Topics Concern   Not on file  Social History Narrative   Not on file   Social Drivers of Health   Financial Resource Strain: Not on file  Food  Insecurity: Not on file  Transportation Needs: Not on file  Physical Activity: Not on file  Stress: Not on file  Social Connections: Not on file  Intimate Partner Violence: Not on file    Family History  Problem Relation Age of Onset   Prolactinoma Neg Hx    Prostate cancer Neg Hx    Kidney cancer Neg Hx     No Known Allergies  Outpatient Medications Prior to Visit  Medication Sig   colchicine 0.6 MG tablet Take 1 tablet (0.6 mg total) by mouth daily. Take 1st pill today then another pill 2 hrs after, then daily from the next day until pain free.   folic acid (FOLVITE) 1 MG tablet TAKE ONE TABLET (1 MG) BY MOUTH EVERY DAY   folic acid (FOLVITE) 800 MCG tablet Take 400 mcg by mouth daily.   hydrALAZINE (APRESOLINE) 25 MG tablet TAKE ONE TABLET (25 MG) BY MOUTH TWICE DAILY   magnesium oxide (MAG-OX) 400 (240 Mg) MG tablet Take 400 mg by mouth daily.   prednisoLONE acetate (PRED FORTE) 1 % ophthalmic suspension Place 1 drop into both eyes 4 (four) times daily.   rivaroxaban (XARELTO) 10 MG TABS tablet Take 1 tablet (10 mg total) by mouth daily.   rosuvastatin (CRESTOR) 5 MG tablet TAKE ONE TABLET BY MOUTH AT BEDTIME   tadalafil (  CIALIS) 5 MG tablet Take 1-2 tablets (5-10 mg total) by mouth daily as needed for erectile dysfunction.   tamsulosin (FLOMAX) 0.4 MG CAPS capsule Take 1 capsule (0.4 mg total) by mouth daily.   VITAMIN D PO Take 2,000 Units by mouth daily.   Zinc Citrate-Phytase (ZYTAZE) 25-500 MG CAPS Take 25-500 mg by mouth daily.   [DISCONTINUED] levothyroxine (SYNTHROID) 100 MCG tablet Take 1 tablet (100 mcg total) by mouth daily before breakfast.   [DISCONTINUED] olmesartan-hydrochlorothiazide (BENICAR HCT) 40-12.5 MG tablet Take 1 tablet by mouth daily.   No facility-administered medications prior to visit.    Review of Systems  Constitutional: Negative.   HENT: Negative.    Eyes: Negative.   Respiratory: Negative.    Cardiovascular: Negative.   Gastrointestinal:  Negative.   Genitourinary: Negative.   Musculoskeletal:  Positive for joint pain (chronic right knee).  Skin: Negative.   Neurological: Negative.   Endo/Heme/Allergies: Negative.        Objective:   BP 122/76   Pulse 85   Temp 98 F (36.7 C)   Ht 6\' 2"  (1.88 m)   Wt 224 lb (101.6 kg)   SpO2 98%   BMI 28.76 kg/m   Vitals:   11/17/23 1021  BP: 122/76  Pulse: 85  Temp: 98 F (36.7 C)  Height: 6\' 2"  (1.88 m)  Weight: 224 lb (101.6 kg)  SpO2: 98%  BMI (Calculated): 28.75    Physical Exam Vitals reviewed.  Constitutional:      Appearance: Normal appearance.  HENT:     Head: Normocephalic.     Left Ear: There is no impacted cerumen.     Nose: Nose normal.     Mouth/Throat:     Mouth: Mucous membranes are moist.     Pharynx: No posterior oropharyngeal erythema.  Eyes:     Extraocular Movements: Extraocular movements intact.     Pupils: Pupils are equal, round, and reactive to light.  Cardiovascular:     Rate and Rhythm: Regular rhythm.     Chest Wall: PMI is not displaced.     Pulses: Normal pulses.     Heart sounds: Normal heart sounds. No murmur heard.    Comments: With Ted hose in place. Pulmonary:     Effort: Pulmonary effort is normal.     Breath sounds: Normal air entry. No rhonchi or rales.  Abdominal:     General: Abdomen is flat. Bowel sounds are normal. There is no distension.     Palpations: Abdomen is soft. There is no hepatomegaly, splenomegaly or mass.     Tenderness: There is no abdominal tenderness.  Musculoskeletal:        General: Normal range of motion.     Cervical back: Normal range of motion and neck supple.     Right knee: No swelling. Tenderness (overlying the patella) present.     Right lower leg: 1+ Pitting Edema present.     Left lower leg: 1+ Pitting Edema present.     Comments: Small right knee joint effusion  Skin:    General: Skin is warm and dry.  Neurological:     General: No focal deficit present.     Mental Status: He  is alert and oriented to person, place, and time.     Cranial Nerves: No cranial nerve deficit.     Motor: No weakness.  Psychiatric:        Mood and Affect: Mood normal.        Behavior: Behavior  normal.      No results found for any visits on 11/17/23.  Recent Results (from the past 2160 hours)  PSA     Status: Abnormal   Collection Time: 10/15/23  9:38 AM  Result Value Ref Range   Prostate Specific Ag, Serum 11.9 (H) 0.0 - 4.0 ng/mL    Comment: Roche ECLIA methodology. According to the American Urological Association, Serum PSA should decrease and remain at undetectable levels after radical prostatectomy. The AUA defines biochemical recurrence as an initial PSA value 0.2 ng/mL or greater followed by a subsequent confirmatory PSA value 0.2 ng/mL or greater. Values obtained with different assay methods or kits cannot be used interchangeably. Results cannot be interpreted as absolute evidence of the presence or absence of malignant disease.   Comprehensive metabolic panel     Status: Abnormal   Collection Time: 11/07/23 11:28 AM  Result Value Ref Range   Glucose 116 (H) 70 - 99 mg/dL   BUN 15 8 - 27 mg/dL   Creatinine, Ser 4.40 (H) 0.76 - 1.27 mg/dL   eGFR 61 >10 UV/OZD/6.64   BUN/Creatinine Ratio 12 10 - 24   Sodium 144 134 - 144 mmol/L   Potassium 4.3 3.5 - 5.2 mmol/L   Chloride 105 96 - 106 mmol/L   CO2 25 20 - 29 mmol/L   Calcium 9.5 8.6 - 10.2 mg/dL   Total Protein 8.0 6.0 - 8.5 g/dL   Albumin 4.3 3.9 - 4.9 g/dL   Globulin, Total 3.7 1.5 - 4.5 g/dL   Bilirubin Total 1.1 0.0 - 1.2 mg/dL   Alkaline Phosphatase 68 44 - 121 IU/L   AST 16 0 - 40 IU/L   ALT 25 0 - 44 IU/L  Hemoglobin A1c     Status: Abnormal   Collection Time: 11/07/23 11:28 AM  Result Value Ref Range   Hgb A1c MFr Bld 6.0 (H) 4.8 - 5.6 %    Comment:          Prediabetes: 5.7 - 6.4          Diabetes: >6.4          Glycemic control for adults with diabetes: <7.0    Est. average glucose Bld gHb  Est-mCnc 126 mg/dL  TSH     Status: None   Collection Time: 11/07/23 11:29 AM  Result Value Ref Range   TSH 0.936 0.450 - 4.500 uIU/mL  Hemoglobin A1c     Status: Abnormal   Collection Time: 11/14/23  1:37 PM  Result Value Ref Range   Hgb A1c MFr Bld 5.7 (H) 4.8 - 5.6 %    Comment:          Prediabetes: 5.7 - 6.4          Diabetes: >6.4          Glycemic control for adults with diabetes: <7.0    Est. average glucose Bld gHb Est-mCnc 117 mg/dL      Assessment & Plan:  As per problem list and advised to wear compression stockings daily. Problem List Items Addressed This Visit       Cardiovascular and Mediastinum   Primary hypertension   Relevant Medications   olmesartan-hydrochlorothiazide (BENICAR HCT) 40-12.5 MG tablet   furosemide (LASIX) 20 MG tablet     Other   Mixed hyperlipidemia   Relevant Medications   olmesartan-hydrochlorothiazide (BENICAR HCT) 40-12.5 MG tablet   furosemide (LASIX) 20 MG tablet   Other Visit Diagnoses       Venous  insufficiency of both lower extremities    -  Primary   Relevant Medications   olmesartan-hydrochlorothiazide (BENICAR HCT) 40-12.5 MG tablet   furosemide (LASIX) 20 MG tablet     Other specified hypothyroidism       Relevant Medications   levothyroxine (SYNTHROID) 100 MCG tablet       Return in about 3 months (around 02/17/2024) for awv with labs prior.   Total time spent: 20 minutes  Luna Fuse, MD  11/17/2023   This document may have been prepared by [facility] Voice Recognition software and as such may include unintentional dictation errors.

## 2023-12-08 ENCOUNTER — Other Ambulatory Visit: Payer: Self-pay | Admitting: Internal Medicine

## 2023-12-08 DIAGNOSIS — I10 Essential (primary) hypertension: Secondary | ICD-10-CM

## 2023-12-17 ENCOUNTER — Ambulatory Visit (INDEPENDENT_AMBULATORY_CARE_PROVIDER_SITE_OTHER): Payer: Medicare Other | Admitting: Urology

## 2023-12-17 ENCOUNTER — Encounter: Payer: Self-pay | Admitting: Urology

## 2023-12-17 ENCOUNTER — Other Ambulatory Visit
Admission: RE | Admit: 2023-12-17 | Discharge: 2023-12-17 | Disposition: A | Discharge status: Home / Self Care | Attending: Urology | Admitting: Urology

## 2023-12-17 ENCOUNTER — Other Ambulatory Visit: Payer: Self-pay

## 2023-12-17 VITALS — BP 132/81 | Ht 74.0 in | Wt 229.0 lb

## 2023-12-17 DIAGNOSIS — N138 Other obstructive and reflux uropathy: Secondary | ICD-10-CM | POA: Diagnosis present

## 2023-12-17 DIAGNOSIS — N401 Enlarged prostate with lower urinary tract symptoms: Secondary | ICD-10-CM

## 2023-12-17 DIAGNOSIS — N529 Male erectile dysfunction, unspecified: Secondary | ICD-10-CM | POA: Diagnosis not present

## 2023-12-17 DIAGNOSIS — N3281 Overactive bladder: Secondary | ICD-10-CM

## 2023-12-17 DIAGNOSIS — R351 Nocturia: Secondary | ICD-10-CM

## 2023-12-17 DIAGNOSIS — R972 Elevated prostate specific antigen [PSA]: Secondary | ICD-10-CM

## 2023-12-17 LAB — URINALYSIS, COMPLETE (UACMP) WITH MICROSCOPIC
Bilirubin Urine: NEGATIVE
Glucose, UA: NEGATIVE mg/dL
Hgb urine dipstick: NEGATIVE
Ketones, ur: NEGATIVE mg/dL
Leukocytes,Ua: NEGATIVE
Nitrite: NEGATIVE
Protein, ur: 30 mg/dL — AB
Specific Gravity, Urine: 1.02 (ref 1.005–1.030)
pH: 5.5 (ref 5.0–8.0)

## 2023-12-17 LAB — BLADDER SCAN AMB NON-IMAGING: Scan Result: 0

## 2023-12-17 NOTE — Progress Notes (Signed)
   12/17/2023 2:00 PM   Fernando Cole 1956-06-22 [id number]  Reason for visit: Follow up elevated PSA, urinary symptoms, ED  HPI: 68 year-old African-American male from [place] with history of DVT/PE on Xarelto and long history of elevated PSA and extensive work-up.  To briefly summarize, he underwent a negative prostate biopsy in 2017 for an elevated PSA of 5, underwent a 4K score with a 4% risk of finding a clinically significant prostate cancer, as well as a negative prostate MRI in 2018 that showed no suspicious lesions.  Percentage free PSA has always been reassuring at >20%.  Prostate volume was 72 cc on prostate MRI.  PSA density is <0.15.  He has a history of isolated significantly elevated PSA values of 20-25 when the PSA was drawn within a few days of ejaculations, and these have all dropped down on repeat testing.  Urinalysis at our last visit was completely benign, and PVR was normal.  DRE has been benign   PSA remains elevated but stable including most recently 11.9 from February 2025, 12.14 February 2023, 11.12 August 2022, 9.13 August 2021.  He opts to continue PSA monitoring every 6 to 12 months.   He also has problems with erectile dysfunction, currently using Cialis 5 to 10 mg on demand with good results.    At our visit in February 2025 he reported new urinary frequency, weak stream, nocturia 5-6 times at night.  He was started on Flomax and does have some improvement on that medication.  He also started on Lasix by his PCP for lower extremity edema, and he has been taking that around 10 PM which likely is a large contributor to his nocturia.  We discussed compression stockings, elevation of the legs in the afternoon, and changing the timing of the Lasix dosing to prevent nocturia.  We also discussed other options like cystoscopy/TRUS and consideration of outlet procedures, though I think he is nocturia likely multifactorial and may be more related to Lasix and lower extremity edema  as opposed to prostate issues.  PVR today again normal at 0ml.  Urinalysis last visit was benign, but urinalysis today with 6-10 RBC.  He would like to repeat UA at next visit prior to considering cystoscopy/CT.  Continue Cialis and Flomax Can continue PSA monitoring yearly Behavioral strategies discussed at length regarding lower extremity edema and Lasix and nocturia RTC 6 months UA, PVR     Sondra Come, MD  [facility] [address], [address] [place], [place] [address] [phone]

## 2023-12-17 NOTE — Patient Instructions (Signed)

## 2024-01-07 ENCOUNTER — Other Ambulatory Visit: Payer: Self-pay | Admitting: Internal Medicine

## 2024-02-20 ENCOUNTER — Other Ambulatory Visit

## 2024-02-20 DIAGNOSIS — R7303 Prediabetes: Secondary | ICD-10-CM

## 2024-02-20 DIAGNOSIS — E782 Mixed hyperlipidemia: Secondary | ICD-10-CM

## 2024-02-20 LAB — HEMOGLOBIN A1C
Est. average glucose Bld gHb Est-mCnc: 128 mg/dL
Hgb A1c MFr Bld: 6.1 % — ABNORMAL HIGH (ref 4.8–5.6)

## 2024-02-21 ENCOUNTER — Other Ambulatory Visit: Payer: Self-pay | Admitting: Internal Medicine

## 2024-02-21 LAB — COMPREHENSIVE METABOLIC PANEL WITH GFR
ALT: 23 IU/L (ref 0–44)
AST: 18 IU/L (ref 0–40)
Albumin: 4.4 g/dL (ref 3.9–4.9)
Alkaline Phosphatase: 63 IU/L (ref 44–121)
BUN/Creatinine Ratio: 8 — ABNORMAL LOW (ref 10–24)
BUN: 11 mg/dL (ref 8–27)
Bilirubin Total: 1 mg/dL (ref 0.0–1.2)
CO2: 20 mmol/L (ref 20–29)
Calcium: 9.3 mg/dL (ref 8.6–10.2)
Chloride: 102 mmol/L (ref 96–106)
Creatinine, Ser: 1.37 mg/dL — ABNORMAL HIGH (ref 0.76–1.27)
Globulin, Total: 3.4 g/dL (ref 1.5–4.5)
Glucose: 106 mg/dL — ABNORMAL HIGH (ref 70–99)
Potassium: 4 mmol/L (ref 3.5–5.2)
Sodium: 141 mmol/L (ref 134–144)
Total Protein: 7.8 g/dL (ref 6.0–8.5)
eGFR: 57 mL/min/{1.73_m2} — ABNORMAL LOW (ref >59)

## 2024-02-24 ENCOUNTER — Encounter: Payer: Self-pay | Admitting: Internal Medicine

## 2024-02-24 ENCOUNTER — Ambulatory Visit: Admitting: Internal Medicine

## 2024-02-24 ENCOUNTER — Ambulatory Visit: Payer: Self-pay | Admitting: Internal Medicine

## 2024-02-24 VITALS — BP 105/80 | HR 100 | Temp 97.7°F | Ht 74.0 in | Wt 229.8 lb

## 2024-02-24 DIAGNOSIS — E782 Mixed hyperlipidemia: Secondary | ICD-10-CM | POA: Diagnosis not present

## 2024-02-24 DIAGNOSIS — E038 Other specified hypothyroidism: Secondary | ICD-10-CM

## 2024-02-24 DIAGNOSIS — R7303 Prediabetes: Secondary | ICD-10-CM

## 2024-02-24 DIAGNOSIS — I10 Essential (primary) hypertension: Secondary | ICD-10-CM

## 2024-02-24 DIAGNOSIS — R270 Ataxia, unspecified: Secondary | ICD-10-CM

## 2024-02-24 DIAGNOSIS — Z0001 Encounter for general adult medical examination with abnormal findings: Secondary | ICD-10-CM

## 2024-02-24 MED ORDER — OLMESARTAN MEDOXOMIL-HCTZ 40-12.5 MG PO TABS: 1.0000 | ORAL_TABLET | Freq: Every day | ORAL | 2 refills | Status: DC

## 2024-02-24 NOTE — Progress Notes (Signed)
 Established Patient Office Visit  Subjective:  Patient ID: Fernando Cole, male    DOB: 29-Dec-1955  Age: 68 y.o. MRN: [id number]  Chief Complaint  Patient presents with   Annual Exam    AWV    C/o loss of balance and shuffling gait x 1 year denies any tremors. Here for AWV refer to quality metrics and scanned documents.  Reports recent gout flare relieved by colchicine .    No other concerns at this time.   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)    Gout    Heart murmur    Hypertension    PE (pulmonary thromboembolism) (HCC)     Past Surgical History:  Procedure Laterality Date   CATARACT EXTRACTION W/PHACO Left 06/18/2022   Procedure: CATARACT EXTRACTION PHACO AND INTRAOCULAR LENS PLACEMENT (IOC) LEFT eyhance toric;  Surgeon: Clair Crews, MD;  Location: [facility];  Service: Ophthalmology;  Laterality: Left;  4.32 00:26.2   CATARACT EXTRACTION W/PHACO Right 07/02/2022   Procedure: CATARACT EXTRACTION PHACO AND INTRAOCULAR LENS PLACEMENT (IOC) RIGHT Eyhance Toric 3.41 00:28.0;  Surgeon: Clair Crews, MD;  Location: [facility];  Service: Ophthalmology;  Laterality: Right;   THYROIDECTOMY  2013    Social History   Socioeconomic History   Marital status: Married    Spouse name: Not on file   Number of children: Not on file   Years of education: Not on file   Highest education level: Not on file  Occupational History   Not on file  Tobacco Use   Smoking status: Former    Current packs/day: 0.00    Types: Cigarettes    Quit date: 2013    Years since quitting: 12.4   Smokeless tobacco: Never  Vaping Use   Vaping status: Never Used  Substance and Sexual Activity   Alcohol use: Never   Drug use: Never   Sexual activity: Not on file  Other Topics Concern   Not on file  Social History Narrative   Not on file   Social Drivers of Health   Financial Resource Strain: Low Risk  (02/23/2024)    Received from [facility]   Overall Financial Resource Strain (CARDIA)    Difficulty of Paying Living Expenses: Not very hard  Food Insecurity: No Food Insecurity (02/23/2024)   Received from [facility]   Hunger Vital Sign    Within the past 12 months, you worried that your food would run out before you got the money to buy more.: Never true    Within the past 12 months, the food you bought just didn't last and you didn't have money to get more.: Never true  Transportation Needs: No Transportation Needs (02/23/2024)   Received from [facility] - Transportation    In the past 12 months, has lack of transportation kept you from medical appointments or from getting medications?: No    Lack of Transportation (Non-Medical): No  Physical Activity: Not on file  Stress: Not on file  Social Connections: Not on file  Intimate Partner Violence: Not on file    Family History  Problem Relation Age of Onset   Prolactinoma Neg Hx    Prostate cancer Neg Hx    Kidney cancer Neg Hx     No Known Allergies  Outpatient Medications Prior to Visit  Medication Sig   colchicine  0.6 MG tablet Take 1 tablet (0.6 mg total) by  mouth daily. Take 1st pill today then another pill 2 hrs after, then daily from the next day until pain free.   folic acid (FOLVITE) 1 MG tablet TAKE 1 TABLET BY MOUTH DAILY   furosemide  (LASIX ) 20 MG tablet Take 1 tablet (20 mg total) by mouth daily.   hydrALAZINE (APRESOLINE) 25 MG tablet TAKE ONE TABLET (25 MG) BY MOUTH TWICE DAILY   levothyroxine  (SYNTHROID ) 100 MCG tablet Take 1 tablet (100 mcg total) by mouth daily before breakfast.   magnesium oxide (MAG-OX) 400 (240 Mg) MG tablet Take 400 mg by mouth daily.   olmesartan -hydrochlorothiazide (BENICAR  HCT) 40-12.5 MG tablet TAKE ONE TABLET EVERY DAY   prednisoLONE acetate (PRED FORTE) 1 % ophthalmic suspension Place 1 drop into both eyes 4 (four) times daily.    rivaroxaban  (XARELTO ) 10 MG TABS tablet TAKE 1 TABLET DAILY   rosuvastatin (CRESTOR) 5 MG tablet TAKE ONE TABLET BY MOUTH AT BEDTIME   tadalafil  (CIALIS ) 5 MG tablet Take 1-2 tablets (5-10 mg total) by mouth daily as needed for erectile dysfunction.   tamsulosin  (FLOMAX ) 0.4 MG CAPS capsule Take 1 capsule (0.4 mg total) by mouth daily.   vitamin B-12 (CYANOCOBALAMIN) 50 MCG tablet Take 50 mcg by mouth daily.   VITAMIN D PO Take 2,000 Units by mouth daily.   Zinc Citrate-Phytase (ZYTAZE) 25-500 MG CAPS Take 25-500 mg by mouth daily.   No facility-administered medications prior to visit.    Review of Systems  Constitutional: Negative.   HENT: Negative.    Eyes: Negative.   Respiratory: Negative.    Cardiovascular: Negative.   Gastrointestinal: Negative.   Genitourinary: Negative.   Musculoskeletal:  Positive for joint pain (chronic right knee).  Skin: Negative.   Neurological: Negative.   Endo/Heme/Allergies: Negative.        Objective:   BP 105/80   Pulse 100   Temp 97.7 F (36.5 C)   Ht 6' 2 (1.88 m)   Wt 229 lb 12.8 oz (104.2 kg)   SpO2 96%   BMI 29.50 kg/m   Vitals:   02/24/24 1034  BP: 105/80  Pulse: 100  Temp: 97.7 F (36.5 C)  Height: 6' 2 (1.88 m)  Weight: 229 lb 12.8 oz (104.2 kg)  SpO2: 96%  BMI (Calculated): 29.49    Physical Exam Vitals reviewed.  Constitutional:      Appearance: Normal appearance.  HENT:     Head: Normocephalic.     Left Ear: There is no impacted cerumen.     Nose: Nose normal.     Mouth/Throat:     Mouth: Mucous membranes are moist.     Pharynx: No posterior oropharyngeal erythema.   Eyes:     Extraocular Movements: Extraocular movements intact.     Pupils: Pupils are equal, round, and reactive to light.    Cardiovascular:     Rate and Rhythm: Regular rhythm.     Chest Wall: PMI is not displaced.     Pulses: Normal pulses.     Heart sounds: Normal heart sounds. No murmur heard.    Comments: With Ted hose in  place. Pulmonary:     Effort: Pulmonary effort is normal.     Breath sounds: Normal air entry. No rhonchi or rales.  Abdominal:     General: Abdomen is flat. Bowel sounds are normal. There is no distension.     Palpations: Abdomen is soft. There is no hepatomegaly, splenomegaly or mass.     Tenderness: There is no abdominal tenderness.  Musculoskeletal:        General: Normal range of motion.     Cervical back: Normal range of motion and neck supple.     Right knee: No swelling. Tenderness (overlying the patella) present.     Right lower leg: 1+ Pitting Edema present.     Left lower leg: 1+ Pitting Edema present.     Comments: Small right knee joint effusion   Skin:    General: Skin is warm and dry.   Neurological:     General: No focal deficit present.     Mental Status: He is alert and oriented to person, place, and time.     Cranial Nerves: No cranial nerve deficit.     Motor: No weakness.     Coordination: Heel to Shin Test abnormal.     Gait: Gait abnormal (ataxic).   Psychiatric:        Mood and Affect: Mood normal.        Behavior: Behavior normal.      No results found for any visits on 02/24/24.  Recent Results (from the past 2160 hours)  Urinalysis, Complete w Microscopic -     Status: Abnormal   Collection Time: 12/17/23 11:09 AM  Result Value Ref Range   Color, Urine YELLOW YELLOW   APPearance CLEAR CLEAR   Specific Gravity, Urine 1.020 1.005 - 1.030   pH 5.5 5.0 - 8.0   Glucose, UA NEGATIVE NEGATIVE mg/dL   Hgb urine dipstick NEGATIVE NEGATIVE   Bilirubin Urine NEGATIVE NEGATIVE   Ketones, ur NEGATIVE NEGATIVE mg/dL   Protein, ur 30 (A) NEGATIVE mg/dL   Nitrite NEGATIVE NEGATIVE   Leukocytes,Ua NEGATIVE NEGATIVE   Squamous Epithelial / HPF 0-5 0 - 5 /HPF   WBC, UA 0-5 0 - 5 WBC/hpf   RBC / HPF 6-10 0 - 5 RBC/hpf   Bacteria, UA FEW (A) NONE SEEN   Mucus PRESENT     Comment: Performed at [facility], [address] [place],  [place] [address]  Bladder Scan (Post Void Residual) in office     Status: None   Collection Time: 12/17/23 11:25 AM  Result Value Ref Range   Scan Result 0   Hemoglobin A1c     Status: Abnormal   Collection Time: 02/20/24 10:57 AM  Result Value Ref Range   Hgb A1c MFr Bld 6.1 (H) 4.8 - 5.6 %    Comment:          Prediabetes: 5.7 - 6.4          Diabetes: >6.4          Glycemic control for adults with diabetes: <7.0    Est. average glucose Bld gHb Est-mCnc 128 mg/dL  Comprehensive metabolic panel     Status: Abnormal   Collection Time: 02/20/24 10:58 AM  Result Value Ref Range   Glucose 106 (H) 70 - 99 mg/dL   BUN 11 8 - 27 mg/dL   Creatinine, Ser 9.14 (H) 0.76 - 1.27 mg/dL   eGFR 57 (L) >78 GN/FAO/1.30   BUN/Creatinine Ratio 8 (L) 10 - 24   Sodium 141 134 - 144 mmol/L   Potassium 4.0 3.5 - 5.2 mmol/L   Chloride 102 96 - 106 mmol/L   CO2 20 20 - 29 mmol/L   Calcium 9.3 8.6 - 10.2 mg/dL   Total Protein 7.8 6.0 - 8.5 g/dL   Albumin 4.4 3.9 - 4.9 g/dL   Globulin, Total 3.4 1.5 - 4.5 g/dL  Bilirubin Total 1.0 0.0 - 1.2 mg/dL   Alkaline Phosphatase 63 44 - 121 IU/L   AST 18 0 - 40 IU/L   ALT 23 0 - 44 IU/L      Assessment & Plan:  As per problem list  Problem List Items Addressed This Visit   None   No follow-ups on file.   Total time spent: 30 minutes  Arzella Bitters, MD  02/24/2024   This document may have been prepared by [facility] Voice Recognition software and as such may include unintentional dictation errors.

## 2024-02-25 ENCOUNTER — Ambulatory Visit: Payer: Self-pay | Admitting: Internal Medicine

## 2024-02-25 LAB — LIPID PANEL
Chol/HDL Ratio: 3.5 ratio (ref 0.0–5.0)
Cholesterol, Total: 141 mg/dL (ref 100–199)
HDL: 40 mg/dL (ref >39)
LDL Chol Calc (NIH): 87 mg/dL (ref 0–99)
Triglycerides: 70 mg/dL (ref 0–149)
VLDL Cholesterol Cal: 14 mg/dL (ref 5–40)

## 2024-02-25 LAB — SPECIMEN STATUS REPORT

## 2024-02-25 LAB — TSH: TSH: 1.34 u[IU]/mL (ref 0.450–4.500)

## 2024-03-10 ENCOUNTER — Other Ambulatory Visit: Payer: Self-pay

## 2024-03-10 ENCOUNTER — Encounter
Admission: RE | Disposition: A | Discharge status: Home / Self Care | Payer: Self-pay | Source: Home / Self Care | Attending: Gastroenterology

## 2024-03-10 ENCOUNTER — Ambulatory Visit: Admitting: Registered Nurse

## 2024-03-10 ENCOUNTER — Encounter: Payer: Self-pay | Admitting: Gastroenterology

## 2024-03-10 ENCOUNTER — Ambulatory Visit
Admission: RE | Admit: 2024-03-10 | Discharge: 2024-03-10 | Disposition: A | Discharge status: Home / Self Care | Attending: Gastroenterology | Admitting: Gastroenterology

## 2024-03-10 DIAGNOSIS — Z7901 Long term (current) use of anticoagulants: Secondary | ICD-10-CM | POA: Diagnosis not present

## 2024-03-10 DIAGNOSIS — I10 Essential (primary) hypertension: Secondary | ICD-10-CM | POA: Insufficient documentation

## 2024-03-10 DIAGNOSIS — Z1211 Encounter for screening for malignant neoplasm of colon: Secondary | ICD-10-CM | POA: Insufficient documentation

## 2024-03-10 DIAGNOSIS — Z87891 Personal history of nicotine dependence: Secondary | ICD-10-CM | POA: Insufficient documentation

## 2024-03-10 DIAGNOSIS — Z86711 Personal history of pulmonary embolism: Secondary | ICD-10-CM | POA: Diagnosis not present

## 2024-03-10 DIAGNOSIS — E039 Hypothyroidism, unspecified: Secondary | ICD-10-CM | POA: Insufficient documentation

## 2024-03-10 DIAGNOSIS — D123 Benign neoplasm of transverse colon: Secondary | ICD-10-CM | POA: Diagnosis not present

## 2024-03-10 DIAGNOSIS — N289 Disorder of kidney and ureter, unspecified: Secondary | ICD-10-CM | POA: Diagnosis not present

## 2024-03-10 HISTORY — PX: COLONOSCOPY: SHX5424

## 2024-03-10 HISTORY — PX: POLYPECTOMY: SHX149

## 2024-03-10 SURGERY — COLONOSCOPY
Anesthesia: General

## 2024-03-10 MED ORDER — PROPOFOL 500 MG/50ML IV EMUL
INTRAVENOUS | Status: DC | PRN
  Administered 2024-03-10: 140 ug/kg/min via INTRAVENOUS

## 2024-03-10 MED ORDER — PROPOFOL 1000 MG/100ML IV EMUL
INTRAVENOUS | Status: AC
  Filled 2024-03-10: qty 300

## 2024-03-10 MED ORDER — PROPOFOL 10 MG/ML IV BOLUS
INTRAVENOUS | Status: DC | PRN
  Administered 2024-03-10: 70 mg via INTRAVENOUS

## 2024-03-10 MED ORDER — SODIUM CHLORIDE 0.9 % IV SOLN: INTRAVENOUS | Status: DC

## 2024-03-10 NOTE — H&P (Signed)
 Ruel Kung , MD [address], [place], [place], [address] Phone: [phone] Fax: [phone]  Primary Care Physician:  Albina GORMAN Dine, MD   Pre-Procedure History & Physical: HPI:  Fernando Cole is a 68 y.o. male is here for an colonoscopy.   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)    Gout    Heart murmur    Hypertension    PE (pulmonary thromboembolism) (HCC)     Past Surgical History:  Procedure Laterality Date   CATARACT EXTRACTION W/PHACO Left 06/18/2022   Procedure: CATARACT EXTRACTION PHACO AND INTRAOCULAR LENS PLACEMENT (IOC) LEFT eyhance toric;  Surgeon: Jaye Fallow, MD;  Location: [facility];  Service: Ophthalmology;  Laterality: Left;  4.32 00:26.2   CATARACT EXTRACTION W/PHACO Right 07/02/2022   Procedure: CATARACT EXTRACTION PHACO AND INTRAOCULAR LENS PLACEMENT (IOC) RIGHT Eyhance Toric 3.41 00:28.0;  Surgeon: Jaye Fallow, MD;  Location: [facility];  Service: Ophthalmology;  Laterality: Right;   THYROIDECTOMY  2013    Prior to Admission medications   Medication Sig Start Date End Date Taking? Authorizing Provider  colchicine  0.6 MG tablet Take 1 tablet (0.6 mg total) by mouth daily. Take 1st pill today then another pill 2 hrs after, then daily from the next day until pain free. 08/13/23  Yes Albina GORMAN Dine, MD  folic acid (FOLVITE) 1 MG tablet TAKE 1 TABLET BY MOUTH DAILY 02/23/24  Yes Tejan-Sie, GORMAN Dine, MD  furosemide  (LASIX ) 20 MG tablet Take 1 tablet (20 mg total) by mouth daily. 11/17/23 11/16/24 Yes Tejan-Sie, GORMAN Dine, MD  hydrALAZINE (APRESOLINE) 25 MG tablet TAKE ONE TABLET (25 MG) BY MOUTH TWICE DAILY 02/23/24  Yes Tejan-Sie, S Ahmed, MD  levothyroxine  (SYNTHROID ) 100 MCG tablet Take 1 tablet (100 mcg total) by mouth daily before breakfast.  11/17/23  Yes Tejan-Sie, GORMAN Dine, MD  magnesium oxide (MAG-OX) 400 (240 Mg) MG tablet Take 400 mg by mouth daily.   Yes [provider]  olmesartan -hydrochlorothiazide (BENICAR  HCT) 40-12.5 MG tablet Take 1 tablet by mouth daily. 02/24/24 05/24/24 Yes Tejan-Sie, GORMAN Dine, MD  rosuvastatin (CRESTOR) 5 MG tablet TAKE ONE TABLET BY MOUTH AT BEDTIME 11/10/23  Yes Tejan-Sie, GORMAN Dine, MD  tamsulosin  (FLOMAX ) 0.4 MG CAPS capsule Take 1 capsule (0.4 mg total) by mouth daily. 11/05/23  Yes Francisca Redell BROCKS, MD  vitamin B-12 (CYANOCOBALAMIN) 50 MCG tablet Take 50 mcg by mouth daily.   Yes [provider]  VITAMIN D PO Take 2,000 Units by mouth daily.   Yes [provider]  Zinc Citrate-Phytase (ZYTAZE) 25-500 MG CAPS Take 25-500 mg by mouth daily.   Yes [provider]  prednisoLONE acetate (PRED FORTE) 1 % ophthalmic suspension Place 1 drop into both eyes 4 (four) times daily. 07/31/22   [provider]  rivaroxaban  (XARELTO ) 10 MG TABS tablet TAKE 1 TABLET DAILY 01/07/24   Tejan-Sie, S Ahmed, MD  tadalafil  (CIALIS ) 5 MG tablet Take 1-2 tablets (5-10 mg total) by mouth daily as needed for erectile dysfunction. 11/05/23   Francisca Redell BROCKS, MD    Allergies as of 02/25/2024   (No Known Allergies)    Family History  Problem Relation Age of Onset   Prolactinoma Neg Hx    Prostate cancer Neg Hx    Kidney cancer Neg Hx     Social History   Socioeconomic History   Marital status: Married    Spouse name: Not on file  Number of children: Not on file   Years of education: Not on file   Highest education level: Not on file  Occupational History   Not on file  Tobacco Use   Smoking status: Former    Current packs/day: 0.00    Types: Cigarettes    Quit date: 2013    Years since quitting: 12.5   Smokeless tobacco: Never  Vaping Use   Vaping status: Never Used  Substance and Sexual Activity   Alcohol use: Never   Drug use: Never   Sexual activity: Not on file   Other Topics Concern   Not on file  Social History Narrative   Not on file   Social Drivers of Health   Financial Resource Strain: Low Risk  (02/23/2024)   Received from [facility]   Overall Financial Resource Strain (CARDIA)    Difficulty of Paying Living Expenses: Not very hard  Food Insecurity: No Food Insecurity (02/23/2024)   Received from [facility]   Hunger Vital Sign    Within the past 12 months, you worried that your food would run out before you got the money to buy more.: Never true    Within the past 12 months, the food you bought just didn't last and you didn't have money to get more.: Never true  Transportation Needs: No Transportation Needs (02/23/2024)   Received from [facility] - Transportation    In the past 12 months, has lack of transportation kept you from medical appointments or from getting medications?: No    Lack of Transportation (Non-Medical): No  Physical Activity: Not on file  Stress: Not on file  Social Connections: Not on file  Intimate Partner Violence: Not on file    Review of Systems: See HPI, otherwise negative ROS  Physical Exam: BP (!) 167/98   Pulse 76   Temp (!) 96.6 F (35.9 C) (Temporal)   Resp 18   Ht 6' 2 (1.88 m)   Wt 104.1 kg   SpO2 94%   BMI 29.48 kg/m  General:   Alert,  pleasant and cooperative in NAD Head:  Normocephalic and atraumatic. Neck:  Supple; no masses or thyromegaly. Lungs:  Clear throughout to auscultation, normal respiratory effort.    Heart:  +S1, +S2, Regular rate and rhythm, No edema. Abdomen:  Soft, nontender and nondistended. Normal bowel sounds, without guarding, and without rebound.   Neurologic:  Alert and  oriented x4;  grossly normal neurologically.  Impression/Plan: Fernando Cole is here for an colonoscopy to be performed for Screening colonoscopy average risk   Risks, benefits, limitations, and alternatives regarding   colonoscopy have been reviewed with the patient.  Questions have been answered.  All parties agreeable.   Ruel Kung, MD  03/10/2024, 8:26 AM

## 2024-03-10 NOTE — Anesthesia Preprocedure Evaluation (Addendum)
 Anesthesia Evaluation  Patient identified by MRN, date of birth, ID band Patient awake    Reviewed: Allergy & Precautions, H&P , NPO status , Patient's Chart, lab work & pertinent test results  Airway Mallampati: II  TM Distance: >3 FB Neck ROM: full    Dental  (+) Missing   Pulmonary former smoker H/o PE on chronic anticoagulation   Pulmonary exam normal        Cardiovascular hypertension, Normal cardiovascular exam     Neuro/Psych negative neurological ROS  negative psych ROS   GI/Hepatic negative GI ROS, Neg liver ROS,,,  Endo/Other  Hypothyroidism    Renal/GU Renal hypertensionRenal disease  negative genitourinary   Musculoskeletal   Abdominal Normal abdominal exam  (+)   Peds  Hematology negative hematology ROS (+)   Anesthesia Other Findings prediabetes  Past Medical History: No date: 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) No date: Gout No date: Heart murmur No date: Hypertension No date: PE (pulmonary thromboembolism) (HCC)  Past Surgical History: 06/18/2022: CATARACT EXTRACTION W/PHACO; Left     Comment:  Procedure: CATARACT EXTRACTION PHACO AND INTRAOCULAR               LENS PLACEMENT (IOC) LEFT eyhance toric;  Surgeon:               Jaye Fallow, MD;  Location: [facility];                Service: Ophthalmology;  Laterality: Left;  4.32 00:26.2 07/02/2022: CATARACT EXTRACTION W/PHACO; Right     Comment:  Procedure: CATARACT EXTRACTION PHACO AND INTRAOCULAR               LENS PLACEMENT (IOC) RIGHT Eyhance Toric 3.41 00:28.0;                Surgeon: Jaye Fallow, MD;  Location: [facility];  Service: Ophthalmology;  Laterality: Right; 2013: THYROIDECTOMY     Reproductive/Obstetrics negative OB ROS                              Anesthesia Physical Anesthesia Plan  ASA: 2  Anesthesia Plan: General   Post-op Pain  Management:    Induction: Intravenous  PONV Risk Score and Plan: Propofol infusion and TIVA  Airway Management Planned: Natural Airway  Additional Equipment:   Intra-op Plan:   Post-operative Plan:   Informed Consent: I have reviewed the patients History and Physical, chart, labs and discussed the procedure including the risks, benefits and alternatives for the proposed anesthesia with the patient or authorized representative who has indicated his/her understanding and acceptance.     Dental Advisory Given  Plan Discussed with: CRNA and Surgeon  Anesthesia Plan Comments:          Anesthesia Quick Evaluation

## 2024-03-10 NOTE — Anesthesia Postprocedure Evaluation (Signed)
 Anesthesia Post Note  Patient: Fernando Cole  Procedure(s) Performed: COLONOSCOPY POLYPECTOMY, INTESTINE  Patient location during evaluation: Endoscopy Anesthesia Type: General Level of consciousness: awake and alert Pain management: pain level controlled Vital Signs Assessment: post-procedure vital signs reviewed and stable Respiratory status: spontaneous breathing, nonlabored ventilation and respiratory function stable Cardiovascular status: blood pressure returned to baseline and stable Postop Assessment: no apparent nausea or vomiting Anesthetic complications: no   No notable events documented.   Last Vitals:  Vitals:   03/10/24 0903 03/10/24 0913  BP: 112/82 123/83  Pulse: 65 63  Resp: 12 10  Temp:    SpO2: 100% 100%    Last Pain:  Vitals:   03/10/24 0903  TempSrc:   PainSc: 0-No pain                 Fernando Cole

## 2024-03-10 NOTE — Transfer of Care (Signed)
 Immediate Anesthesia Transfer of Care Note  Patient: Fernando Cole  Procedure(s) Performed: COLONOSCOPY POLYPECTOMY, INTESTINE  Patient Location: PACU  Anesthesia Type:General  Level of Consciousness: awake, alert , and oriented  Airway & Oxygen Therapy: Patient Spontanous Breathing  Post-op Assessment: Report given to RN and Post -op Vital signs reviewed and stable  Post vital signs: Reviewed and stable  Last Vitals:  Vitals Value Taken Time  BP 111/83 03/10/24 08:53  Temp    Pulse 78 03/10/24 08:56  Resp 9 03/10/24 08:56  SpO2 93 % 03/10/24 08:56  Vitals shown include unfiled device data.  Last Pain:  Vitals:   03/10/24 0820  TempSrc: Temporal  PainSc: 0-No pain         Complications: No notable events documented.

## 2024-03-10 NOTE — Op Note (Signed)
 [facility]enter[facility] Patient Name: Fernando Cole Procedure Date: 03/10/2024 8:35 AM MRN: [id number] Account #: [id number] Date of Birth: [birth year] Admit Type: Outpatient Age: 68 Room: [facility] ENDO ROOM 3 Gender: Male Note Status: Finalized Instrument Name: Arvis [id number] Procedure:             Colonoscopy Indications:           Screening for colorectal malignant neoplasm Providers:             Ruel Kung MD, MD Referring MD:          Sherrill LABOR. Albina, MD (Referring MD) Medicines:             Monitored Anesthesia Care Complications:         No immediate complications. Procedure:             Pre-Anesthesia Assessment:                        - Prior to the procedure, a History and Physical was                         performed, and patient medications, allergies and                         sensitivities were reviewed. The patient's tolerance                         of previous anesthesia was reviewed.                        - The risks and benefits of the procedure and the                         sedation options and risks were discussed with the                         patient. All questions were answered and informed                         consent was obtained.                        - After reviewing the risks and benefits, the patient                         was deemed in satisfactory condition to undergo the                         procedure.                        - ASA Grade Assessment: II - A patient with mild                         systemic disease.                        After obtaining informed consent, the colonoscope was                         passed under direct vision.  Throughout the procedure,                         the patient's blood pressure, pulse, and oxygen                         saturations were monitored continuously. The                         Colonoscope was introduced through the anus and                          advanced to the the cecum, identified by the                         appendiceal orifice. The colonoscopy was performed                         with ease. The patient tolerated the procedure well.                         The quality of the bowel preparation was good. The                         ileocecal valve, appendiceal orifice, and rectum were                         photographed. Findings:      The perianal and digital rectal examinations were normal.      A 5 mm polyp was found in the transverse colon. The polyp was sessile.       The polyp was removed with a cold snare. Resection and retrieval were       complete.      The exam was otherwise without abnormality on direct and retroflexion       views. Impression:            - One 5 mm polyp in the transverse colon, removed with                         a cold snare. Resected and retrieved.                        - The examination was otherwise normal on direct and                         retroflexion views. Recommendation:        - Discharge patient to home (with escort).                        - Resume previous diet.                        - Continue present medications.                        - Await pathology results.                        - Repeat colonoscopy for surveillance based on  pathology results. Procedure Code(s):     --- Professional ---                        [phone], Colonoscopy, flexible; with removal of                         tumor(s), polyp(s), or other lesion(s) by snare                         technique Diagnosis Code(s):     --- Professional ---                        Z12.11, Encounter for screening for malignant neoplasm                         of colon                        D12.3, Benign neoplasm of transverse colon (hepatic                         flexure or splenic flexure) CPT copyright 2022 American Medical Association. All rights reserved. The codes documented in this report  are preliminary and upon coder review may  be revised to meet current compliance requirements. Ruel Kung, MD Ruel Kung MD, MD 03/10/2024 8:52:43 AM This report has been signed electronically. Number of Addenda: 0 Note Initiated On: 03/10/2024 8:35 AM Scope Withdrawal Time: 0 hours 8 minutes 43 seconds  Total Procedure Duration: 0 hours 10 minutes 56 seconds  Estimated Blood Loss:  Estimated blood loss: none.      [facility]

## 2024-03-11 LAB — SURGICAL PATHOLOGY

## 2024-03-13 ENCOUNTER — Emergency Department
Admission: EM | Admit: 2024-03-13 | Discharge: 2024-03-13 | Disposition: A | Discharge status: Home / Self Care | Attending: Emergency Medicine | Admitting: Emergency Medicine

## 2024-03-13 ENCOUNTER — Other Ambulatory Visit: Payer: Self-pay

## 2024-03-13 ENCOUNTER — Emergency Department

## 2024-03-13 DIAGNOSIS — R42 Dizziness and giddiness: Secondary | ICD-10-CM | POA: Insufficient documentation

## 2024-03-13 DIAGNOSIS — I10 Essential (primary) hypertension: Secondary | ICD-10-CM | POA: Insufficient documentation

## 2024-03-13 DIAGNOSIS — R55 Syncope and collapse: Secondary | ICD-10-CM | POA: Diagnosis present

## 2024-03-13 LAB — COMPREHENSIVE METABOLIC PANEL WITH GFR
ALT: 46 U/L — ABNORMAL HIGH (ref 0–44)
AST: 23 U/L (ref 15–41)
Albumin: 3.9 g/dL (ref 3.5–5.0)
Alkaline Phosphatase: 51 U/L (ref 38–126)
Anion gap: 9 (ref 5–15)
BUN: 12 mg/dL (ref 8–23)
CO2: 28 mmol/L (ref 22–32)
Calcium: 8.9 mg/dL (ref 8.9–10.3)
Chloride: 103 mmol/L (ref 98–111)
Creatinine, Ser: 1.31 mg/dL — ABNORMAL HIGH (ref 0.61–1.24)
GFR, Estimated: 60 mL/min — ABNORMAL LOW (ref >60)
Glucose, Bld: 150 mg/dL — ABNORMAL HIGH (ref 70–99)
Potassium: 3.8 mmol/L (ref 3.5–5.1)
Sodium: 140 mmol/L (ref 135–145)
Total Bilirubin: 0.9 mg/dL (ref 0.0–1.2)
Total Protein: 7.8 g/dL (ref 6.5–8.1)

## 2024-03-13 LAB — CBC
HCT: 38.8 % — ABNORMAL LOW (ref 39.0–52.0)
Hemoglobin: 13.1 g/dL (ref 13.0–17.0)
MCH: 32.2 pg (ref 26.0–34.0)
MCHC: 33.8 g/dL (ref 30.0–36.0)
MCV: 95.3 fL (ref 80.0–100.0)
Platelets: 251 K/uL (ref 150–400)
RBC: 4.07 MIL/uL — ABNORMAL LOW (ref 4.22–5.81)
RDW: 12.6 % (ref 11.5–15.5)
WBC: 6.9 K/uL (ref 4.0–10.5)
nRBC: 0 % (ref 0.0–0.2)

## 2024-03-13 LAB — URINALYSIS, ROUTINE W REFLEX MICROSCOPIC
Bilirubin Urine: NEGATIVE
Glucose, UA: NEGATIVE mg/dL
Hgb urine dipstick: NEGATIVE
Ketones, ur: NEGATIVE mg/dL
Leukocytes,Ua: NEGATIVE
Nitrite: NEGATIVE
Protein, ur: NEGATIVE mg/dL
Specific Gravity, Urine: 1.015 (ref 1.005–1.030)
pH: 5 (ref 5.0–8.0)

## 2024-03-13 LAB — TROPONIN I (HIGH SENSITIVITY): Troponin I (High Sensitivity): 4 ng/L (ref <18)

## 2024-03-13 MED ORDER — SODIUM CHLORIDE 0.9 % IV SOLN: Freq: Once | INTRAVENOUS | Status: AC

## 2024-03-13 NOTE — ED Provider Notes (Signed)
 [facility] Provider Note    Event Date/Time   First MD Initiated Contact with Patient 03/13/24 1053     (approximate)   History   Loss of Consciousness and Dizziness   HPI  Fernando Cole is a 68 y.o. male with a history of DVT, hypertension, on blood thinners who presents after syncopal episode.  Patient reports he was feeling well this morning, went outside to move the trash can, when he came back inside he felt dizzy, he was drinking water and then apparently syncopized.  He denies palpitations.  Denies chest pain.  No neurodeficits.  No nausea or vomiting.  No seizure-like activity.     Physical Exam   Triage Vital Signs: ED Triage Vitals  Encounter Vitals Group     BP 03/13/24 1028 (!) 119/92     Girls Systolic BP Percentile --      Girls Diastolic BP Percentile --      Boys Systolic BP Percentile --      Boys Diastolic BP Percentile --      Pulse Rate 03/13/24 1028 76     Resp 03/13/24 1028 15     Temp 03/13/24 1028 97.9 F (36.6 C)     Temp Source 03/13/24 1028 Oral     SpO2 03/13/24 1028 99 %     Weight 03/13/24 1029 104.1 kg (229 lb 8 oz)     Height 03/13/24 1029 1.88 m (6' 2)     Head Circumference --      Peak Flow --      Pain Score 03/13/24 1029 0     Pain Loc --      Pain Education --      Exclude from Growth Chart --     Most recent vital signs: Vitals:   03/13/24 1430 03/13/24 1456  BP: 139/87   Pulse: 69   Resp: 18   Temp:  98.1 F (36.7 C)  SpO2: 99%      General: Awake, no distress.  CV:  Good peripheral perfusion.  Regular rate and rhythm Resp:  Normal effort.  Auscultation bilaterally Abd:  No distention.  Soft, nontender Other:  Normal neurologic exam No obvious signs of head trauma, no neck pain, no vertebral tenderness palpation   ED Results / Procedures / Treatments   Labs (all labs ordered are listed, but only abnormal results are displayed) Labs Reviewed  COMPREHENSIVE METABOLIC PANEL WITH GFR  - Abnormal; Notable for the following components:      Result Value   Glucose, Bld 150 (*)    Creatinine, Ser 1.31 (*)    ALT 46 (*)    GFR, Estimated 60 (*)    All other components within normal limits  CBC - Abnormal; Notable for the following components:   RBC 4.07 (*)    HCT 38.8 (*)    All other components within normal limits  URINALYSIS, ROUTINE W REFLEX MICROSCOPIC - Abnormal; Notable for the following components:   Color, Urine YELLOW (*)    APPearance CLEAR (*)    All other components within normal limits  TROPONIN I (HIGH SENSITIVITY)     EKG  ED ECG REPORT I, Lamar Price, the attending physician, personally viewed and interpreted this ECG.  Date: 03/13/2024  Rhythm: normal sinus rhythm QRS Axis: normal Intervals: Right bundle-branch block ST/T Wave abnormalities: normal Narrative Interpretation: no evidence of acute ischemia    RADIOLOGY CT head viewed interpret by me, no acute abnormality, confirmed by  radiology    PROCEDURES:  Critical Care performed:   Procedures   MEDICATIONS ORDERED IN ED: Medications  0.9 %  sodium chloride  infusion (0 mLs Intravenous Stopped 03/13/24 1456)     IMPRESSION / MDM / ASSESSMENT AND PLAN / ED COURSE  I reviewed the triage vital signs and the nursing notes. Patient's presentation is most consistent with acute presentation with potential threat to life or bodily function.   Patient presents after syncopal episode with head injury while on blood thinners.  Differential includes vasovagal episode, dehydration, ICH.  Labs CT pending.  Lab work is overall reassuring, CT head is without acute abnormality.  Orthostatics are abnormal however will give IV fluids.  ----------------------------------------- 2:56 PM on 03/13/2024 ----------------------------------------- After fluids patient is feeling much improved,, no indication for admission at this time, he would strongly prefer discharge, strict return  precautions, he agrees with this plan     FINAL CLINICAL IMPRESSION(S) / ED DIAGNOSES   Final diagnoses:  Syncope and collapse     Rx / DC Orders   ED Discharge Orders     None        Note:  This document was prepared using Dragon voice recognition software and may include unintentional dictation errors.   Arlander Charleston, MD 03/13/24 [phone]

## 2024-03-13 NOTE — ED Triage Notes (Addendum)
 Pt here after a LOC and dizziness this morning. Pt states he woke up fine and took the trash out and then passed out and hit the floor. Pt states he feels fine at the moment aside from some dizziness. Pt denies hitting his head. Pt is on a blood thinner and has a colonoscopy on the 2nd. Pt takes Xarelto .

## 2024-03-13 NOTE — ED Notes (Signed)
 Pt given urinal for urine sample and wipe.

## 2024-03-15 ENCOUNTER — Ambulatory Visit: Payer: Self-pay | Admitting: Gastroenterology

## 2024-03-15 NOTE — Progress Notes (Signed)
 April inform patientabout bx results.    Surveillance colonoscopy interval   Repeat colonoscopy in 7 years. Please update chart and send letter.    C/c Albina GORMAN Dine, MD    Regards  Dr Ruel Kung MD,MRCP [facility]) Gastroenterology/Hepatology Pager: [phone]

## 2024-03-23 ENCOUNTER — Encounter: Payer: Self-pay | Admitting: Internal Medicine

## 2024-03-23 ENCOUNTER — Ambulatory Visit (INDEPENDENT_AMBULATORY_CARE_PROVIDER_SITE_OTHER): Admitting: Internal Medicine

## 2024-03-23 VITALS — BP 122/88 | HR 98 | Temp 98.8°F | Ht 74.0 in | Wt 229.4 lb

## 2024-03-23 DIAGNOSIS — I951 Orthostatic hypotension: Secondary | ICD-10-CM

## 2024-03-23 DIAGNOSIS — I10 Essential (primary) hypertension: Secondary | ICD-10-CM

## 2024-03-23 DIAGNOSIS — E038 Other specified hypothyroidism: Secondary | ICD-10-CM

## 2024-03-23 DIAGNOSIS — R7303 Prediabetes: Secondary | ICD-10-CM

## 2024-03-23 NOTE — Progress Notes (Signed)
 Established Patient Office Visit  Subjective:  Patient ID: Fernando Cole, male    DOB: 06/07/1956  Age: 68 y.o. MRN: [id number]  Chief Complaint  Patient presents with   Follow-up    Follow up after colonoscopy    ER f/u after syncope and collapse attributed to hypovolemia for which he received IV fluids with improvement and was sent home. Underwent colonoscopy one or two days earlier which was notable for a single polyp. No further episodes of dizziness    No other concerns at this time.   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)    Gout    Heart murmur    Hypertension    PE (pulmonary thromboembolism) (HCC)     Past Surgical History:  Procedure Laterality Date   CATARACT EXTRACTION W/PHACO Left 06/18/2022   Procedure: CATARACT EXTRACTION PHACO AND INTRAOCULAR LENS PLACEMENT (IOC) LEFT eyhance toric;  Surgeon: Jaye Fallow, MD;  Location: [facility];  Service: Ophthalmology;  Laterality: Left;  4.32 00:26.2   CATARACT EXTRACTION W/PHACO Right 07/02/2022   Procedure: CATARACT EXTRACTION PHACO AND INTRAOCULAR LENS PLACEMENT (IOC) RIGHT Eyhance Toric 3.41 00:28.0;  Surgeon: Jaye Fallow, MD;  Location: [facility];  Service: Ophthalmology;  Laterality: Right;   COLONOSCOPY N/A 03/10/2024   Procedure: COLONOSCOPY;  Surgeon: Therisa Bi, MD;  Location: [facility];  Service: Gastroenterology;  Laterality: N/A;  Patient on Xarelto    POLYPECTOMY  03/10/2024   Procedure: POLYPECTOMY, INTESTINE;  Surgeon: Therisa Bi, MD;  Location: [facility] END[facility];  Service: Gastroenterology;;   THYROIDECTOMY  2013    Social History   Socioeconomic History   Marital status: Married    Spouse name: Not on file   Number of children: Not on file   Years of education: Not on file   Highest education level: Not on file  Occupational History   Not on file  Tobacco Use   Smoking status: Former    Current packs/day: 0.00     Types: Cigarettes    Quit date: 2013    Years since quitting: 12.5   Smokeless tobacco: Never  Vaping Use   Vaping status: Never Used  Substance and Sexual Activity   Alcohol use: Never   Drug use: Never   Sexual activity: Not on file  Other Topics Concern   Not on file  Social History Narrative   Not on file   Social Drivers of Health   Financial Resource Strain: Low Risk  (02/23/2024)   Received from [facility]   Overall Financial Resource Strain (CARDIA)    Difficulty of Paying Living Expenses: Not very hard  Food Insecurity: No Food Insecurity (02/23/2024)   Received from [facility]   Hunger Vital Sign    Within the past 12 months, you worried that your food would run out before you got the money to buy more.: Never true    Within the past 12 months, the food you bought just didn't last and you didn't have money to get more.: Never true  Transportation Needs: No Transportation Needs (02/23/2024)   Received from [facility] - Transportation    In the past 12 months, has lack of transportation kept you from medical appointments or from getting medications?: No    Lack of Transportation (Non-Medical): No  Physical Activity: Not on file  Stress: Not on file  Social Connections: Not on file  Intimate Partner Violence: Not  on file    Family History  Problem Relation Age of Onset   Prolactinoma Neg Hx    Prostate cancer Neg Hx    Kidney cancer Neg Hx     No Known Allergies  Outpatient Medications Prior to Visit  Medication Sig   colchicine  0.6 MG tablet Take 1 tablet (0.6 mg total) by mouth daily. Take 1st pill today then another pill 2 hrs after, then daily from the next day until pain free.   folic acid (FOLVITE) 1 MG tablet TAKE 1 TABLET BY MOUTH DAILY   furosemide  (LASIX ) 20 MG tablet Take 1 tablet (20 mg total) by mouth daily.   hydrALAZINE (APRESOLINE) 25 MG tablet TAKE ONE TABLET (25 MG) BY MOUTH  TWICE DAILY   levothyroxine  (SYNTHROID ) 100 MCG tablet Take 1 tablet (100 mcg total) by mouth daily before breakfast.   magnesium oxide (MAG-OX) 400 (240 Mg) MG tablet Take 400 mg by mouth daily.   olmesartan -hydrochlorothiazide (BENICAR  HCT) 40-12.5 MG tablet Take 1 tablet by mouth daily.   prednisoLONE acetate (PRED FORTE) 1 % ophthalmic suspension Place 1 drop into both eyes 4 (four) times daily.   rivaroxaban  (XARELTO ) 10 MG TABS tablet TAKE 1 TABLET DAILY   rosuvastatin (CRESTOR) 5 MG tablet TAKE ONE TABLET BY MOUTH AT BEDTIME   tadalafil  (CIALIS ) 5 MG tablet Take 1-2 tablets (5-10 mg total) by mouth daily as needed for erectile dysfunction.   tamsulosin  (FLOMAX ) 0.4 MG CAPS capsule Take 1 capsule (0.4 mg total) by mouth daily.   vitamin B-12 (CYANOCOBALAMIN) 50 MCG tablet Take 50 mcg by mouth daily.   VITAMIN D PO Take 2,000 Units by mouth daily.   Zinc Citrate-Phytase (ZYTAZE) 25-500 MG CAPS Take 25-500 mg by mouth daily.   No facility-administered medications prior to visit.    ROS     Objective:   BP 122/88   Pulse 98   Temp 98.8 F (37.1 C)   Ht 6' 2 (1.88 m)   Wt 229 lb 6.4 oz (104.1 kg)   SpO2 95%   BMI 29.45 kg/m   Vitals:   03/23/24 1259  BP: 122/88  Pulse: 98  Temp: 98.8 F (37.1 C)  Height: 6' 2 (1.88 m)  Weight: 229 lb 6.4 oz (104.1 kg)  SpO2: 95%  BMI (Calculated): 29.44    Physical Exam   No results found for any visits on 03/23/24.  Recent Results (from the past 2160 hours)  Hemoglobin A1c     Status: Abnormal   Collection Time: 02/20/24 10:57 AM  Result Value Ref Range   Hgb A1c MFr Bld 6.1 (H) 4.8 - 5.6 %    Comment:          Prediabetes: 5.7 - 6.4          Diabetes: >6.4          Glycemic control for adults with diabetes: <7.0    Est. average glucose Bld gHb Est-mCnc 128 mg/dL  Comprehensive metabolic panel     Status: Abnormal   Collection Time: 02/20/24 10:58 AM  Result Value Ref Range   Glucose 106 (H) 70 - 99 mg/dL   BUN 11 8  - 27 mg/dL   Creatinine, Ser 8.62 (H) 0.76 - 1.27 mg/dL   eGFR 57 (L) >40 fO/fpw/8.26   BUN/Creatinine Ratio 8 (L) 10 - 24   Sodium 141 134 - 144 mmol/L   Potassium 4.0 3.5 - 5.2 mmol/L   Chloride 102 96 - 106 mmol/L  CO2 20 20 - 29 mmol/L   Calcium 9.3 8.6 - 10.2 mg/dL   Total Protein 7.8 6.0 - 8.5 g/dL   Albumin 4.4 3.9 - 4.9 g/dL   Globulin, Total 3.4 1.5 - 4.5 g/dL   Bilirubin Total 1.0 0.0 - 1.2 mg/dL   Alkaline Phosphatase 63 44 - 121 IU/L   AST 18 0 - 40 IU/L   ALT 23 0 - 44 IU/L  Lipid panel     Status: None   Collection Time: 02/20/24 10:58 AM  Result Value Ref Range   Cholesterol, Total 141 100 - 199 mg/dL   Triglycerides 70 0 - 149 mg/dL   HDL 40 >60 mg/dL   VLDL Cholesterol Cal 14 5 - 40 mg/dL   LDL Chol Calc (NIH) 87 0 - 99 mg/dL   Chol/HDL Ratio 3.5 0.0 - 5.0 ratio    Comment:                                   T. Chol/HDL Ratio                                             Men  Women                               1/2 Avg.Risk  3.4    3.3                                   Avg.Risk  5.0    4.4                                2X Avg.Risk  9.6    7.1                                3X Avg.Risk 23.4   11.0   TSH     Status: None   Collection Time: 02/20/24 10:58 AM  Result Value Ref Range   TSH 1.340 0.450 - 4.500 uIU/mL  Specimen status report     Status: None   Collection Time: 02/20/24 10:58 AM  Result Value Ref Range   specimen status report Comment     Comment: Written Authorization Written Authorization Written Authorization Received. Authorization received from Bellamy Rubey AHMED TEJAN SIE 02-24-2024 Logged by Othel Gash   Surgical pathology     Status: None   Collection Time: 03/10/24 12:00 AM  Result Value Ref Range   SURGICAL PATHOLOGY      SURGICAL PATHOLOGY [facility] [address] [place], [place] [address] Telephone [phone] or [phone] Fax [phone]  REPORT OF SURGICAL PATHOLOGY   Accession #:  [id number] Patient Name: TONNY, ISENSEE Visit # : [id number]  MRN: [id number] Physician: Therisa Bi DOB/Age November 16, 1955 (Age: 26) Gender: M Collected Date: 03/10/2024 Received Date: 03/10/2024  FINAL DIAGNOSIS       1. Transverse Colon Polyp, cold snare :       - TUBULAR ADENOMA(Nile Prisk)      - NEGATIVE FOR HIGH-GRADE DYSPLASIA OR MALIGNANCY  DATE SIGNED OUT: 03/11/2024 ELECTRONIC SIGNATURE : Rebbecca Md, Nilesh, Pathologist, Electronic Signature  MICROSCOPIC DESCRIPTION  CASE COMMENTS STAINS USED IN DIAGNOSIS: H&E    CLINICAL HISTORY  SPECIMEN(Amirra Herling) OBTAINED 1. Transverse Colon Polyp, Cold Snare  SPECIMEN COMMENTS: SPECIMEN CLINICAL INFORMATION: 1. Polyp    Gross Description 1. Transverse colon polyp cold snare, received in formalin is  a 0.5 x 0.3 x 0.1 cm aggregate of 4 white-gray tissue fragments. The specimen is filtered and submitted in toto in 1 block (1A).      AMG 03/10/2024        Report signed out from the following location(Jaelon Gatley) [place] [address] [place], [place] [address] CLIA #: [id number]  [facility] [address] [place], [place] [address] CLIA #: [id number]   Comprehensive metabolic panel     Status: Abnormal   Collection Time: 03/13/24 11:10 AM  Result Value Ref Range   Sodium 140 135 - 145 mmol/L   Potassium 3.8 3.5 - 5.1 mmol/L   Chloride 103 98 - 111 mmol/L   CO2 28 22 - 32 mmol/L   Glucose, Bld 150 (H) 70 - 99 mg/dL    Comment: Glucose reference range applies only to samples taken after fasting for at least 8 hours.   BUN 12 8 - 23 mg/dL   Creatinine, Ser 8.68 (H) 0.61 - 1.24 mg/dL   Calcium 8.9 8.9 - 89.6 mg/dL   Total Protein 7.8 6.5 - 8.1 g/dL   Albumin 3.9 3.5 - 5.0 g/dL   AST 23 15 - 41 U/L   ALT 46 (H) 0 - 44 U/L   Alkaline Phosphatase 51 38 - 126 U/L   Total Bilirubin 0.9 0.0 - 1.2 mg/dL   GFR, Estimated 60 (L) >60 mL/min    Comment: (NOTE) Calculated using the CKD-EPI  Creatinine Equation (2021)    Anion gap 9 5 - 15    Comment: Performed at [facility], [address] [place], [place] [address]  CBC     Status: Abnormal   Collection Time: 03/13/24 11:10 AM  Result Value Ref Range   WBC 6.9 4.0 - 10.5 K/uL   RBC 4.07 (L) 4.22 - 5.81 MIL/uL   Hemoglobin 13.1 13.0 - 17.0 g/dL   HCT 61.1 (L) 60.9 - 47.9 %   MCV 95.3 80.0 - 100.0 fL   MCH 32.2 26.0 - 34.0 pg   MCHC 33.8 30.0 - 36.0 g/dL   RDW 87.3 88.4 - 84.4 %   Platelets 251 150 - 400 K/uL   nRBC 0.0 0.0 - 0.2 %    Comment: Performed at [facility], [address] [place], [place] [address]  Troponin I (High Sensitivity)     Status: None   Collection Time: 03/13/24 11:10 AM  Result Value Ref Range   Troponin I (High Sensitivity) 4 <18 ng/L    Comment: (NOTE) Elevated high sensitivity troponin I (hsTnI) values and significant  changes across serial measurements may suggest ACS but many other  chronic and acute conditions are known to elevate hsTnI results.  Refer to the Links section for chest pain algorithms and additional  guidance. Performed at [facility], [address] [place], [place] [address]   Urinalysis, Routine w reflex microscopic -Urine, Clean Catch     Status: Abnormal   Collection Time: 03/13/24 11:40 AM  Result Value Ref Range   Color, Urine YELLOW (A) YELLOW   APPearance CLEAR (A) CLEAR  Specific Gravity, Urine 1.015 1.005 - 1.030   pH 5.0 5.0 - 8.0   Glucose, UA NEGATIVE NEGATIVE mg/dL   Hgb urine dipstick NEGATIVE NEGATIVE   Bilirubin Urine NEGATIVE NEGATIVE   Ketones, ur NEGATIVE NEGATIVE mg/dL   Protein, ur NEGATIVE NEGATIVE mg/dL   Nitrite NEGATIVE NEGATIVE   Leukocytes,Ua NEGATIVE NEGATIVE    Comment: Performed at [facility], [address], [place], [place] [address]      Assessment & Plan:  As per problem list  Problem List Items Addressed This Visit       Cardiovascular and Mediastinum   Primary  hypertension   Other Visit Diagnoses       Orthostatic hypotension    -  Primary     Other specified hypothyroidism           Return if symptoms worsen or fail to improve.   Total time spent: 20 minutes  Sherrill Cinderella Perry, MD  03/23/2024   This document may have been prepared by [facility] Voice Recognition software and as such may include unintentional dictation errors.

## 2024-03-24 ENCOUNTER — Encounter: Payer: Self-pay | Admitting: Urology

## 2024-05-21 ENCOUNTER — Other Ambulatory Visit: Payer: Self-pay

## 2024-05-21 MED ORDER — HYDRALAZINE HCL 25 MG PO TABS: 25.0000 mg | ORAL_TABLET | Freq: Two times a day (BID) | ORAL | 1 refills | Status: AC

## 2024-05-21 MED ORDER — FOLIC ACID 1 MG PO TABS: 1.0000 mg | ORAL_TABLET | Freq: Every day | ORAL | 3 refills | Status: AC

## 2024-05-24 ENCOUNTER — Other Ambulatory Visit

## 2024-05-25 LAB — LIPID PANEL
Chol/HDL Ratio: 3.6 ratio (ref 0.0–5.0)
Cholesterol, Total: 146 mg/dL (ref 100–199)
HDL: 41 mg/dL (ref >39)
LDL Chol Calc (NIH): 91 mg/dL (ref 0–99)
Triglycerides: 72 mg/dL (ref 0–149)
VLDL Cholesterol Cal: 14 mg/dL (ref 5–40)

## 2024-05-25 LAB — TSH: TSH: 1.77 u[IU]/mL (ref 0.450–4.500)

## 2024-05-26 ENCOUNTER — Encounter: Payer: Self-pay | Admitting: Urology

## 2024-05-26 ENCOUNTER — Ambulatory Visit: Admitting: Internal Medicine

## 2024-06-11 ENCOUNTER — Ambulatory Visit: Admitting: Internal Medicine

## 2024-06-14 ENCOUNTER — Ambulatory Visit (INDEPENDENT_AMBULATORY_CARE_PROVIDER_SITE_OTHER): Admitting: Internal Medicine

## 2024-06-14 ENCOUNTER — Other Ambulatory Visit: Payer: Self-pay

## 2024-06-14 ENCOUNTER — Encounter: Payer: Self-pay | Admitting: Internal Medicine

## 2024-06-14 VITALS — BP 128/82 | HR 92 | Temp 96.3°F | Ht 74.0 in | Wt 228.6 lb

## 2024-06-14 DIAGNOSIS — M109 Gout, unspecified: Secondary | ICD-10-CM

## 2024-06-14 DIAGNOSIS — R42 Dizziness and giddiness: Secondary | ICD-10-CM | POA: Diagnosis not present

## 2024-06-14 DIAGNOSIS — E039 Hypothyroidism, unspecified: Secondary | ICD-10-CM | POA: Diagnosis not present

## 2024-06-14 DIAGNOSIS — Z131 Encounter for screening for diabetes mellitus: Secondary | ICD-10-CM

## 2024-06-14 DIAGNOSIS — E038 Other specified hypothyroidism: Secondary | ICD-10-CM

## 2024-06-14 DIAGNOSIS — N138 Other obstructive and reflux uropathy: Secondary | ICD-10-CM

## 2024-06-14 DIAGNOSIS — I8290 Acute embolism and thrombosis of unspecified vein: Secondary | ICD-10-CM | POA: Insufficient documentation

## 2024-06-14 DIAGNOSIS — I10 Essential (primary) hypertension: Secondary | ICD-10-CM | POA: Diagnosis not present

## 2024-06-14 DIAGNOSIS — E782 Mixed hyperlipidemia: Secondary | ICD-10-CM

## 2024-06-14 DIAGNOSIS — N3281 Overactive bladder: Secondary | ICD-10-CM

## 2024-06-14 MED ORDER — RIVAROXABAN 10 MG PO TABS: 10.0000 mg | ORAL_TABLET | Freq: Every day | ORAL | 1 refills | Status: DC

## 2024-06-14 MED ORDER — OLMESARTAN MEDOXOMIL-HCTZ 40-12.5 MG PO TABS: 1.0000 | ORAL_TABLET | Freq: Every day | ORAL | 2 refills | Status: DC

## 2024-06-14 MED ORDER — ROSUVASTATIN CALCIUM 5 MG PO TABS: 5.0000 mg | ORAL_TABLET | Freq: Every day | ORAL | 0 refills | Status: DC

## 2024-06-14 NOTE — Progress Notes (Signed)
 Established Patient Office Visit  Subjective:  Patient ID: Fernando Cole, male    DOB: 1955/09/20  Age: 68 y.o. MRN: [id number]  Chief Complaint  Patient presents with   Follow-up    3 month lab results    No new complaints, here for lab review and medication refills. Labs reviewed and notable for well controlled lipids and TSH at goal.    No other concerns at this time.   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)    Gout    Heart murmur    Hypertension    PE (pulmonary thromboembolism) (HCC)     Past Surgical History:  Procedure Laterality Date   CATARACT EXTRACTION W/PHACO Left 06/18/2022   Procedure: CATARACT EXTRACTION PHACO AND INTRAOCULAR LENS PLACEMENT (IOC) LEFT eyhance toric;  Surgeon: Jaye Fallow, MD;  Location: [facility];  Service: Ophthalmology;  Laterality: Left;  4.32 00:26.2   CATARACT EXTRACTION W/PHACO Right 07/02/2022   Procedure: CATARACT EXTRACTION PHACO AND INTRAOCULAR LENS PLACEMENT (IOC) RIGHT Eyhance Toric 3.41 00:28.0;  Surgeon: Jaye Fallow, MD;  Location: [facility];  Service: Ophthalmology;  Laterality: Right;   COLONOSCOPY N/A 03/10/2024   Procedure: COLONOSCOPY;  Surgeon: Therisa Bi, MD;  Location: [facility] END[facility];  Service: Gastroenterology;  Laterality: N/A;  Patient on Xarelto    POLYPECTOMY  03/10/2024   Procedure: POLYPECTOMY, INTESTINE;  Surgeon: Therisa Bi, MD;  Location: [facility] ENDOSCOPY;  Service: Gastroenterology;;   THYROIDECTOMY  2013    Social History   Socioeconomic History   Marital status: Married    Spouse name: Not on file   Number of children: Not on file   Years of education: Not on file   Highest education level: Not on file  Occupational History   Not on file  Tobacco Use   Smoking status: Former    Current packs/day: 0.00    Types: Cigarettes    Quit date: 2013    Years since quitting: 12.7   Smokeless tobacco: Never  Vaping Use   Vaping  status: Never Used  Substance and Sexual Activity   Alcohol use: Never   Drug use: Never   Sexual activity: Not on file  Other Topics Concern   Not on file  Social History Narrative   Not on file   Social Drivers of Health   Financial Resource Strain: Low Risk  (02/23/2024)   Received from [facility]   Overall Financial Resource Strain (CARDIA)    Difficulty of Paying Living Expenses: Not very hard  Food Insecurity: No Food Insecurity (02/23/2024)   Received from [facility]   Hunger Vital Sign    Within the past 12 months, you worried that your food would run out before you got the money to buy more.: Never true    Within the past 12 months, the food you bought just didn't last and you didn't have money to get more.: Never true  Transportation Needs: No Transportation Needs (02/23/2024)   Received from [facility] - Transportation    In the past 12 months, has lack of transportation kept you from medical appointments or from getting medications?: No    Lack of Transportation (Non-Medical): No  Physical Activity: Not on file  Stress: Not on file  Social Connections: Not on file  Intimate Partner Violence: Not on file    Family History  Problem Relation Age of Onset   Prolactinoma Neg Hx  Prostate cancer Neg Hx    Kidney cancer Neg Hx     No Known Allergies  Outpatient Medications Prior to Visit  Medication Sig   colchicine  0.6 MG tablet Take 1 tablet (0.6 mg total) by mouth daily. Take 1st pill today then another pill 2 hrs after, then daily from the next day until pain free.   folic acid  (FOLVITE ) 1 MG tablet Take 1 tablet (1 mg total) by mouth daily.   furosemide  (LASIX ) 20 MG tablet Take 1 tablet (20 mg total) by mouth daily.   hydrALAZINE  (APRESOLINE ) 25 MG tablet Take 1 tablet (25 mg total) by mouth 2 (two) times daily.   levothyroxine  (SYNTHROID ) 100 MCG tablet Take 1 tablet (100 mcg total) by mouth  daily before breakfast.   magnesium oxide (MAG-OX) 400 (240 Mg) MG tablet Take 400 mg by mouth daily.   prednisoLONE acetate (PRED FORTE) 1 % ophthalmic suspension Place 1 drop into both eyes 4 (four) times daily.   tadalafil  (CIALIS ) 5 MG tablet Take 1-2 tablets (5-10 mg total) by mouth daily as needed for erectile dysfunction.   tamsulosin  (FLOMAX ) 0.4 MG CAPS capsule Take 1 capsule (0.4 mg total) by mouth daily.   vitamin B-12 (CYANOCOBALAMIN) 50 MCG tablet Take 50 mcg by mouth daily.   VITAMIN D PO Take 2,000 Units by mouth daily.   Zinc Citrate-Phytase (ZYTAZE) 25-500 MG CAPS Take 25-500 mg by mouth daily.   [DISCONTINUED] olmesartan -hydrochlorothiazide (BENICAR  HCT) 40-12.5 MG tablet Take 1 tablet by mouth daily.   [DISCONTINUED] rivaroxaban  (XARELTO ) 10 MG TABS tablet TAKE 1 TABLET DAILY   [DISCONTINUED] rosuvastatin (CRESTOR) 5 MG tablet TAKE ONE TABLET BY MOUTH AT BEDTIME   No facility-administered medications prior to visit.    Review of Systems  Constitutional: Negative.  Negative for weight loss.  HENT: Negative.    Eyes: Negative.   Respiratory: Negative.    Cardiovascular: Negative.   Gastrointestinal: Negative.   Genitourinary: Negative.   Musculoskeletal:  Positive for joint pain (chronic right knee).  Skin: Negative.   Neurological: Negative.   Endo/Heme/Allergies: Negative.        Objective:   BP 128/82   Pulse 92   Temp (!) 96.3 F (35.7 C)   Ht 6' 2 (1.88 m)   Wt 228 lb 9.6 oz (103.7 kg)   SpO2 95%   BMI 29.35 kg/m   Vitals:   06/14/24 1022  BP: 128/82  Pulse: 92  Temp: (!) 96.3 F (35.7 C)  Height: 6' 2 (1.88 m)  Weight: 228 lb 9.6 oz (103.7 kg)  SpO2: 95%  BMI (Calculated): 29.34    Physical Exam Vitals reviewed.  Constitutional:      Appearance: Normal appearance.  HENT:     Head: Normocephalic.     Left Ear: There is no impacted cerumen.     Nose: Nose normal.     Mouth/Throat:     Mouth: Mucous membranes are moist.      Pharynx: No posterior oropharyngeal erythema.  Eyes:     Extraocular Movements: Extraocular movements intact.     Pupils: Pupils are equal, round, and reactive to light.  Cardiovascular:     Rate and Rhythm: Regular rhythm.     Chest Wall: PMI is not displaced.     Pulses: Normal pulses.     Heart sounds: Normal heart sounds. No murmur heard.    Comments: With Ted hose in place. Pulmonary:     Effort: Pulmonary effort is normal.  Breath sounds: Normal air entry. No rhonchi or rales.  Abdominal:     General: Abdomen is flat. Bowel sounds are normal. There is no distension.     Palpations: Abdomen is soft. There is no hepatomegaly, splenomegaly or mass.     Tenderness: There is no abdominal tenderness.  Musculoskeletal:        General: Normal range of motion.     Cervical back: Normal range of motion and neck supple.     Right knee: No swelling. Tenderness (overlying the patella) present.     Right lower leg: 1+ Pitting Edema present.     Left lower leg: 1+ Pitting Edema present.     Comments: Small right knee joint effusion  Skin:    General: Skin is warm and dry.  Neurological:     General: No focal deficit present.     Mental Status: He is alert and oriented to person, place, and time.     Cranial Nerves: No cranial nerve deficit.     Motor: No weakness.     Coordination: Heel to Shin Test abnormal.     Gait: Gait abnormal (ataxic).  Psychiatric:        Mood and Affect: Mood normal.        Behavior: Behavior normal.      No results found for any visits on 06/14/24.  Recent Results (from the past 2160 hours)  Lipid panel     Status: None   Collection Time: 05/24/24 10:49 AM  Result Value Ref Range   Cholesterol, Total 146 100 - 199 mg/dL   Triglycerides 72 0 - 149 mg/dL   HDL 41 >60 mg/dL   VLDL Cholesterol Cal 14 5 - 40 mg/dL   LDL Chol Calc (NIH) 91 0 - 99 mg/dL   Chol/HDL Ratio 3.6 0.0 - 5.0 ratio    Comment:                                   T. Chol/HDL  Ratio                                             Men  Women                               1/2 Avg.Risk  3.4    3.3                                   Avg.Risk  5.0    4.4                                2X Avg.Risk  9.6    7.1                                3X Avg.Risk 23.4   11.0   TSH     Status: None   Collection Time: 05/24/24 10:49 AM  Result Value Ref Range   TSH 1.770 0.450 - 4.500 uIU/mL      Assessment & Plan:  Fernando Cole was  seen today for follow-up.  Primary hypertension -     Olmesartan  Medoxomil-HCTZ; Take 1 tablet by mouth daily.  Dispense: 30 tablet; Refill: 2  Hypothyroidism (acquired)  Dizziness  Other specified hypothyroidism -     TSH  Mixed hyperlipidemia -     Comprehensive metabolic panel with GFR -     Rosuvastatin Calcium; Take 1 tablet (5 mg total) by mouth at bedtime.  Dispense: 90 tablet; Refill: 0 -     Lipid panel  Acute gout of right knee, unspecified cause  VTE (venous thromboembolism) -     Rivaroxaban ; Take 1 tablet (10 mg total) by mouth daily.  Dispense: 90 tablet; Refill: 1    Problem List Items Addressed This Visit       Cardiovascular and Mediastinum   Primary hypertension - Primary   Relevant Medications   olmesartan -hydrochlorothiazide (BENICAR  HCT) 40-12.5 MG tablet   rivaroxaban  (XARELTO ) 10 MG TABS tablet   rosuvastatin (CRESTOR) 5 MG tablet   VTE (venous thromboembolism)   Relevant Medications   olmesartan -hydrochlorothiazide (BENICAR  HCT) 40-12.5 MG tablet   rivaroxaban  (XARELTO ) 10 MG TABS tablet   rosuvastatin (CRESTOR) 5 MG tablet     Endocrine   Hypothyroidism (acquired)     Musculoskeletal and Integument   Acute gout of right knee     Other   Mixed hyperlipidemia   Relevant Medications   olmesartan -hydrochlorothiazide (BENICAR  HCT) 40-12.5 MG tablet   rivaroxaban  (XARELTO ) 10 MG TABS tablet   rosuvastatin (CRESTOR) 5 MG tablet   Other Relevant Orders   Lipid panel   Other Visit Diagnoses        Dizziness         Other specified hypothyroidism           No follow-ups on file.   Total time spent: 20 minutes  Sherrill Cinderella Perry, MD  06/14/2024   This document may have been prepared by [facility] Voice Recognition software and as such may include unintentional dictation errors.

## 2024-06-15 ENCOUNTER — Ambulatory Visit: Admitting: Urology

## 2024-06-15 ENCOUNTER — Ambulatory Visit (INDEPENDENT_AMBULATORY_CARE_PROVIDER_SITE_OTHER): Admitting: Urology

## 2024-06-15 ENCOUNTER — Other Ambulatory Visit
Admission: RE | Admit: 2024-06-15 | Discharge: 2024-06-15 | Disposition: A | Discharge status: Home / Self Care | Attending: Urology | Admitting: Urology

## 2024-06-15 VITALS — BP 131/84 | HR 96 | Ht 74.0 in | Wt 228.2 lb

## 2024-06-15 DIAGNOSIS — N138 Other obstructive and reflux uropathy: Secondary | ICD-10-CM | POA: Diagnosis present

## 2024-06-15 DIAGNOSIS — N401 Enlarged prostate with lower urinary tract symptoms: Secondary | ICD-10-CM

## 2024-06-15 DIAGNOSIS — N3281 Overactive bladder: Secondary | ICD-10-CM | POA: Diagnosis present

## 2024-06-15 DIAGNOSIS — R3129 Other microscopic hematuria: Secondary | ICD-10-CM

## 2024-06-15 DIAGNOSIS — R972 Elevated prostate specific antigen [PSA]: Secondary | ICD-10-CM

## 2024-06-15 LAB — URINALYSIS, COMPLETE (UACMP) WITH MICROSCOPIC
Glucose, UA: NEGATIVE mg/dL
Leukocytes,Ua: NEGATIVE
Nitrite: NEGATIVE
Protein, ur: 30 mg/dL — AB
Specific Gravity, Urine: 1.025 (ref 1.005–1.030)
pH: 5.5 (ref 5.0–8.0)

## 2024-06-15 LAB — BLADDER SCAN AMB NON-IMAGING

## 2024-06-15 NOTE — Progress Notes (Signed)
   06/15/2024 4:24 PM   Fernando Cole [birth year] [id number]  Reason for visit: Follow up elevated PSA, urinary symptoms, ED, microscopic hematuria  History: Previously followed in [place] , long history of negative workup for elevated PSA.  Negative prostate biopsy 2017 for PSA of 5.  4K score 4% chance of clinically significant prostate cancer.  Negative prostate MRI 2018, volume 72g.  Percentage free is always been reassuring greater than 20%. Worsening urinary symptoms with frequency, weak stream, nocturia starting February 2025 started on Flomax , also encouraged not to take Lasix  at bedtime Cialis  5 to 10 mg on demand for ED with good results Deferred microscopic hematuria workup April 2025  Physical Exam: BP 131/84 (BP Location: Left Arm, Patient Position: Sitting, Cuff Size: Large)   Pulse 96   Ht 6' 2 (1.88 m)   Wt 228 lb 3.2 oz (103.5 kg)   SpO2 97%   BMI 29.30 kg/m   Imaging/labs: Urinalysis today 6-10 RBC, 6-10 WBC, few bacteria, will send for culture PSA February 2025 11.9, stable over the last few years  Today: Continues to have bothersome urinary symptoms with urgency, frequency, nocturia 5+ times overnight.  IPSS score today 17, quality-of-life unhappy.  PVR normal at 3ml He denies any gross hematuria  Plan:   Microscopic hematuria: Persistent, again recommended workup and reviewed AUA guidelines, he is amenable to CT and cystoscopy.  Cystoscopy will allow for further evaluation of urinary symptoms Elevated PSA: Overall stable, extensive workup has been negative, continue yearly monitoring ED: Continue Cialis , refilled RTC to review CT results and cystoscopy   Fernando JAYSON Burnet, MD  [facility] [address] [place], [place] [address] [phone]

## 2024-06-15 NOTE — Patient Instructions (Addendum)
 Please call [phone] or [phone] to schedule your imaging prior to your appointment.    Cystoscopy Cystoscopy is a procedure that is used to help diagnose and sometimes treat conditions that affect the lower urinary tract. The lower urinary tract includes the bladder and the urethra. The urethra is the tube that drains urine from the bladder. Cystoscopy is done using a thin, tube-shaped instrument with a light and camera at the end (cystoscope). The cystoscope may be hard or flexible, depending on the goal of the procedure. The cystoscope is inserted through the urethra, into the bladder. Cystoscopy may be recommended if you have: Urinary tract infections that keep coming back. Blood in the urine (hematuria). An inability to control when you urinate (urinary incontinence) or an overactive bladder. Unusual cells found in a urine sample. A blockage in the urethra, such as a urinary stone. Painful urination. An abnormality in the bladder found during an intravenous pyelogram (IVP) or CT scan. What are the risks? Generally, this is a safe procedure. However, problems may occur, including: Infection. Bleeding.  What happens during the procedure?  You will be given one or more of the following: A medicine to numb the area (local anesthetic). The area around the opening of your urethra will be cleaned. The cystoscope will be passed through your urethra into your bladder. Germ-free (sterile) fluid will flow through the cystoscope to fill your bladder. The fluid will stretch your bladder so that your health care provider can clearly examine your bladder walls. Your doctor will look at the urethra and bladder. The cystoscope will be removed The procedure may vary among health care providers  What can I expect after the procedure? After the procedure, it is common to have: Some soreness or pain in your urethra. Urinary symptoms. These include: Mild pain or burning when you urinate. Pain  should stop within a few minutes after you urinate. This may last for up to a few days after the procedure. A small amount of blood in your urine for several days. Feeling like you need to urinate but producing only a small amount of urine. Follow these instructions at home: General instructions Return to your normal activities as told by your health care provider.  Drink plenty of fluids after the procedure. Keep all follow-up visits as told by your health care provider. This is important. Contact a health care provider if you: Have pain that gets worse or does not get better with medicine, especially pain when you urinate lasting longer than 72 hours after the procedure. Have trouble urinating. Get help right away if you: Have blood clots in your urine. Have a fever or chills. Are unable to urinate. Summary Cystoscopy is a procedure that is used to help diagnose and sometimes treat conditions that affect the lower urinary tract. Cystoscopy is done using a thin, tube-shaped instrument with a light and camera at the end. After the procedure, it is common to have some soreness or pain in your urethra. It is normal to have blood in your urine after the procedure.  If you were prescribed an antibiotic medicine, take it as told by your health care provider.  This information is not intended to replace advice given to you by your health care provider. Make sure you discuss any questions you have with your health care provider. Document Revised: 08/18/2018 Document Reviewed: 08/18/2018 Elsevier Patient Education  2020 [employer].

## 2024-06-16 ENCOUNTER — Ambulatory Visit: Admitting: Urology

## 2024-06-16 LAB — URINE CULTURE: Culture: NO GROWTH

## 2024-07-06 ENCOUNTER — Ambulatory Visit (INDEPENDENT_AMBULATORY_CARE_PROVIDER_SITE_OTHER): Admitting: Urology

## 2024-07-06 VITALS — BP 131/82 | HR 71 | Wt 226.0 lb

## 2024-07-06 DIAGNOSIS — R3129 Other microscopic hematuria: Secondary | ICD-10-CM

## 2024-07-06 MED ORDER — CEPHALEXIN 500 MG PO CAPS
500.0000 mg | ORAL_CAPSULE | Freq: Once | ORAL | Status: AC
  Administered 2024-07-06: 500 mg via ORAL

## 2024-07-06 MED ORDER — LIDOCAINE HCL URETHRAL/MUCOSAL 2 % EX GEL
1.0000 | Freq: Once | CUTANEOUS | Status: AC
  Administered 2024-07-06: 1 via URETHRAL

## 2024-07-06 NOTE — Patient Instructions (Signed)
 Please Call [phone] to schedule CT Scan

## 2024-07-06 NOTE — Progress Notes (Signed)
 Cystoscopy Procedure Note:  Indication: microscopic hematuria, urinary symptoms  After informed consent and discussion of the procedure and its risks, Adonias Demore was positioned and prepped in the standard fashion. Cystoscopy was performed with a flexible cystoscope. The urethra, bladder neck and entire bladder was visualized in a standard fashion. The prostate was very large with obstructive lateral lobes and an elevated bladder neck. The ureteral orifices were visualized in their normal location and orientation.  Mild to moderate bladder trabeculations, no suspicious lesions, intravesical protrusion of the prostate on retroflexion.  Imaging: CT urogram not yet scheduled  Findings: Enlarged prostate, no suspicious bladder lesions  Assessment and Plan: 68 year old male with long history of elevated PSA and negative workup, primarily from his prior urologist in [place] .  Briefly, negative biopsy in 2017 for PSA of 5, 4K score with 4% chance of clinically significant prostate cancer, negative prostate MRI 2018 with prostate volume 72 g, and reassuring percentage free greater than 20% on multiple PSA values.  He has had worsening urinary symptoms with weak stream, frequency, nocturia despite Flomax .  PVRs have been normal.  Will call with CT results, consider increasing Flomax  dose, adding finasteride, or proceeding with HOLEP if he desires definitive treatment pending CT findings for further evaluation of microscopic hematuria as well as prostate volume.  Redell Burnet, MD 07/06/2024

## 2024-07-20 ENCOUNTER — Ambulatory Visit
Admission: RE | Admit: 2024-07-20 | Discharge: 2024-07-20 | Disposition: A | Discharge status: Home / Self Care | Source: Ambulatory Visit | Attending: Urology | Admitting: Urology

## 2024-07-20 DIAGNOSIS — R3129 Other microscopic hematuria: Secondary | ICD-10-CM | POA: Insufficient documentation

## 2024-07-20 MED ORDER — IOHEXOL 300 MG/ML  SOLN
100.0000 mL | Freq: Once | INTRAMUSCULAR | Status: AC | PRN
  Administered 2024-07-20: 100 mL via INTRAVENOUS

## 2024-07-21 ENCOUNTER — Ambulatory Visit: Payer: Self-pay | Admitting: Urology

## 2024-07-21 DIAGNOSIS — N138 Other obstructive and reflux uropathy: Secondary | ICD-10-CM

## 2024-07-21 MED ORDER — FINASTERIDE 5 MG PO TABS: 5.0000 mg | ORAL_TABLET | Freq: Every day | ORAL | 3 refills | Status: AC

## 2024-07-21 NOTE — Telephone Encounter (Signed)
Appt scheduled & RX sent

## 2024-08-19 ENCOUNTER — Other Ambulatory Visit: Payer: Self-pay | Admitting: Neurology

## 2024-08-19 DIAGNOSIS — G912 (Idiopathic) normal pressure hydrocephalus: Secondary | ICD-10-CM

## 2024-08-23 ENCOUNTER — Ambulatory Visit
Admission: RE | Admit: 2024-08-23 | Discharge: 2024-08-23 | Disposition: A | Source: Ambulatory Visit | Attending: Neurology | Admitting: Neurology

## 2024-08-23 ENCOUNTER — Ambulatory Visit

## 2024-08-23 DIAGNOSIS — G912 (Idiopathic) normal pressure hydrocephalus: Secondary | ICD-10-CM

## 2024-08-28 ENCOUNTER — Other Ambulatory Visit: Payer: Self-pay | Admitting: Internal Medicine

## 2024-08-28 DIAGNOSIS — E038 Other specified hypothyroidism: Secondary | ICD-10-CM

## 2024-09-07 ENCOUNTER — Other Ambulatory Visit: Payer: Self-pay

## 2024-09-07 DIAGNOSIS — I8290 Acute embolism and thrombosis of unspecified vein: Secondary | ICD-10-CM

## 2024-09-08 ENCOUNTER — Other Ambulatory Visit: Payer: Self-pay

## 2024-09-08 MED ORDER — RIVAROXABAN 10 MG PO TABS: 10.0000 mg | ORAL_TABLET | Freq: Every day | ORAL | 1 refills | Status: AC

## 2024-09-14 ENCOUNTER — Ambulatory Visit: Admitting: Internal Medicine

## 2024-09-15 ENCOUNTER — Other Ambulatory Visit: Payer: Self-pay | Admitting: Internal Medicine

## 2024-09-15 DIAGNOSIS — E782 Mixed hyperlipidemia: Secondary | ICD-10-CM

## 2024-09-20 ENCOUNTER — Ambulatory Visit: Admitting: Internal Medicine

## 2024-09-22 ENCOUNTER — Other Ambulatory Visit

## 2024-09-23 LAB — TSH: TSH: 1.4 u[IU]/mL (ref 0.450–4.500)

## 2024-09-23 LAB — COMPREHENSIVE METABOLIC PANEL WITH GFR
ALT: 29 IU/L (ref 0–44)
AST: 11 IU/L (ref 0–40)
Albumin: 4.4 g/dL (ref 3.9–4.9)
Alkaline Phosphatase: 66 IU/L (ref 47–123)
BUN/Creatinine Ratio: 12 (ref 10–24)
BUN: 16 mg/dL (ref 8–27)
Bilirubin Total: 0.5 mg/dL (ref 0.0–1.2)
CO2: 22 mmol/L (ref 20–29)
Calcium: 9.8 mg/dL (ref 8.6–10.2)
Chloride: 103 mmol/L (ref 96–106)
Creatinine, Ser: 1.37 mg/dL — ABNORMAL HIGH (ref 0.76–1.27)
Globulin, Total: 4 g/dL (ref 1.5–4.5)
Glucose: 148 mg/dL — ABNORMAL HIGH (ref 70–99)
Potassium: 4.2 mmol/L (ref 3.5–5.2)
Sodium: 143 mmol/L (ref 134–144)
Total Protein: 8.4 g/dL (ref 6.0–8.5)
eGFR: 56 mL/min/1.73 — ABNORMAL LOW

## 2024-09-23 LAB — LIPID PANEL
Chol/HDL Ratio: 3.2 ratio (ref 0.0–5.0)
Cholesterol, Total: 149 mg/dL (ref 100–199)
HDL: 46 mg/dL
LDL Chol Calc (NIH): 88 mg/dL (ref 0–99)
Triglycerides: 79 mg/dL (ref 0–149)
VLDL Cholesterol Cal: 15 mg/dL (ref 5–40)

## 2024-09-24 ENCOUNTER — Ambulatory Visit: Admitting: Internal Medicine

## 2024-09-24 VITALS — BP 124/88 | HR 79 | Ht 74.0 in | Wt 233.2 lb

## 2024-09-24 DIAGNOSIS — E782 Mixed hyperlipidemia: Secondary | ICD-10-CM | POA: Diagnosis not present

## 2024-09-24 DIAGNOSIS — E038 Other specified hypothyroidism: Secondary | ICD-10-CM | POA: Diagnosis not present

## 2024-09-24 DIAGNOSIS — R7303 Prediabetes: Secondary | ICD-10-CM | POA: Diagnosis not present

## 2024-09-24 DIAGNOSIS — R739 Hyperglycemia, unspecified: Secondary | ICD-10-CM | POA: Diagnosis not present

## 2024-09-24 DIAGNOSIS — I10 Essential (primary) hypertension: Secondary | ICD-10-CM

## 2024-09-24 MED ORDER — OLMESARTAN MEDOXOMIL-HCTZ 40-12.5 MG PO TABS: 1.0000 | ORAL_TABLET | Freq: Every day | ORAL | 2 refills | Status: AC

## 2024-09-24 NOTE — Progress Notes (Signed)
 "  Established Patient Office Visit  Subjective:  Patient ID: Fernando Cole, male    DOB: 1955/09/25  Age: 69 y.o. MRN: [id number]  Chief Complaint  Patient presents with   Follow-up    3 month follow up    No new complaints, here for lab review and medication refills. LDL and TC well controlled on lab review. Triglycerides also satisfactory but fasting glucose elevated with normal TSH.     No other concerns at this time.   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)    Gout    Heart murmur    Hypertension    PE (pulmonary thromboembolism) (HCC)     Past Surgical History:  Procedure Laterality Date   CATARACT EXTRACTION W/PHACO Left 06/18/2022   Procedure: CATARACT EXTRACTION PHACO AND INTRAOCULAR LENS PLACEMENT (IOC) LEFT eyhance toric;  Surgeon: Jaye Fallow, MD;  Location: [facility];  Service: Ophthalmology;  Laterality: Left;  4.32 00:26.2   CATARACT EXTRACTION W/PHACO Right 07/02/2022   Procedure: CATARACT EXTRACTION PHACO AND INTRAOCULAR LENS PLACEMENT (IOC) RIGHT Eyhance Toric 3.41 00:28.0;  Surgeon: Jaye Fallow, MD;  Location: [facility];  Service: Ophthalmology;  Laterality: Right;   COLONOSCOPY N/A 03/10/2024   Procedure: COLONOSCOPY;  Surgeon: Therisa Bi, MD;  Location: [facility];  Service: Gastroenterology;  Laterality: N/A;  Patient on Xarelto    POLYPECTOMY  03/10/2024   Procedure: POLYPECTOMY, INTESTINE;  Surgeon: Therisa Bi, MD;  Location: [facility] ENDOSCOPY;  Service: Gastroenterology;;   THYROIDECTOMY  2013    Social History   Socioeconomic History   Marital status: Married    Spouse name: Not on file   Number of children: Not on file   Years of education: Not on file   Highest education level: Not on file  Occupational History   Not on file  Tobacco Use   Smoking status: Former    Current packs/day: 0.00    Types: Cigarettes    Quit date: 2013    Years since quitting: 13.0    Smokeless tobacco: Never  Vaping Use   Vaping status: Never Used  Substance and Sexual Activity   Alcohol use: Never   Drug use: Never   Sexual activity: Not on file  Other Topics Concern   Not on file  Social History Narrative   Not on file   Social Drivers of Health   Tobacco Use: Medium Risk (08/18/2024)   Received from [facility]   Patient History    Smoking Tobacco Use: Former    Smokeless Tobacco Use: Never    Passive Exposure: Not on file  Financial Resource Strain: Low Risk  (08/17/2024)   Received from [facility]   Overall Financial Resource Strain (CARDIA)    Difficulty of Paying Living Expenses: Not hard at all  Food Insecurity: No Food Insecurity (08/17/2024)   Received from [facility]   Epic    Within the past 12 months, you worried that your food would run out before you got the money to buy more.: Never true    Within the past 12 months, the food you bought just didn't last and you didn't have money to get more.: Never true  Transportation Needs: No Transportation Needs (08/17/2024)   Received from [facility]    In the past 12 months, has lack of transportation kept you from medical appointments or from getting medications?: No  Lack of Transportation (Non-Medical): No  Physical Activity: Not on file  Stress: Not on file  Social Connections: Not on file  Intimate Partner Violence: Not on file  Depression (PHQ2-9): Low Risk (02/24/2024)   Depression (PHQ2-9)    PHQ-2 Score: 3  Alcohol Screen: Not on file  Housing: Low Risk  (08/17/2024)   Received from [facility]   Epic    In the last 12 months, was there a time when you were not able to pay the mortgage or rent on time?: No    In the past 12 months, how many times have you moved where you were living?: 0    At any time in the past 12 months, were you homeless or living in a shelter  (including now)?: No  Utilities: Not At Risk (08/17/2024)   Received from [facility]   Epic    In the past 12 months has the electric, gas, oil, or water company threatened to shut off services in your home?: No  Health Literacy: Not on file    Family History  Problem Relation Age of Onset   Prolactinoma Neg Hx    Prostate cancer Neg Hx    Kidney cancer Neg Hx     Allergies[1]  Show/hide medication list[2]  Review of Systems  Constitutional: Negative.  Negative for weight loss (gained 5 lbs).  HENT:         Hoarse voice  Eyes: Negative.   Respiratory: Negative.    Cardiovascular: Negative.   Gastrointestinal: Negative.   Genitourinary: Negative.   Musculoskeletal:  Positive for joint pain (chronic right knee).  Skin: Negative.   Neurological: Negative.   Endo/Heme/Allergies: Negative.        Objective:   BP 124/88   Pulse 79   Ht 6' 2 (1.88 m)   Wt 233 lb 3.2 oz (105.8 kg)   SpO2 94%   BMI 29.94 kg/m   Vitals:   09/24/24 1157  BP: 124/88  Pulse: 79  Height: 6' 2 (1.88 m)  Weight: 233 lb 3.2 oz (105.8 kg)  SpO2: 94%  BMI (Calculated): 29.93    Physical Exam Vitals reviewed.  Constitutional:      Appearance: Normal appearance.  HENT:     Head: Normocephalic.     Left Ear: There is no impacted cerumen.     Nose: Nose normal.     Mouth/Throat:     Mouth: Mucous membranes are moist.     Pharynx: No posterior oropharyngeal erythema.  Eyes:     Extraocular Movements: Extraocular movements intact.     Pupils: Pupils are equal, round, and reactive to light.  Cardiovascular:     Rate and Rhythm: Regular rhythm.     Chest Wall: PMI is not displaced.     Pulses: Normal pulses.     Heart sounds: Normal heart sounds. No murmur heard.    Comments: With Ted hose in place. Pulmonary:     Effort: Pulmonary effort is normal.     Breath sounds: Normal air entry. No rhonchi or rales.  Abdominal:     General: Abdomen is flat. Bowel sounds  are normal. There is no distension.     Palpations: Abdomen is soft. There is no hepatomegaly, splenomegaly or mass.     Tenderness: There is no abdominal tenderness.  Musculoskeletal:        General: Normal range of motion.     Cervical back: Normal range of motion and neck supple.  Right knee: No swelling. Tenderness (overlying the patella) present.     Right lower leg: 1+ Pitting Edema present.     Left lower leg: 1+ Pitting Edema present.     Comments: Small right knee joint effusion  Skin:    General: Skin is warm and dry.  Neurological:     General: No focal deficit present.     Mental Status: He is alert and oriented to person, place, and time.     Cranial Nerves: No cranial nerve deficit.     Motor: No weakness.     Coordination: Heel to Shin Test abnormal.     Gait: Gait abnormal (ataxic).  Psychiatric:        Mood and Affect: Mood normal.        Behavior: Behavior normal.      No results found for any visits on 09/24/24.  Recent Results (from the past 2160 hours)  TSH     Status: None   Collection Time: 09/22/24 10:18 AM  Result Value Ref Range   TSH 1.400 0.450 - 4.500 uIU/mL  Comprehensive metabolic panel     Status: Abnormal   Collection Time: 09/22/24 10:19 AM  Result Value Ref Range   Glucose 148 (H) 70 - 99 mg/dL   BUN 16 8 - 27 mg/dL   Creatinine, Ser 8.62 (H) 0.76 - 1.27 mg/dL   eGFR 56 (L) >40 fO/fpw/8.26   BUN/Creatinine Ratio 12 10 - 24   Sodium 143 134 - 144 mmol/L   Potassium 4.2 3.5 - 5.2 mmol/L   Chloride 103 96 - 106 mmol/L   CO2 22 20 - 29 mmol/L   Calcium  9.8 8.6 - 10.2 mg/dL   Total Protein 8.4 6.0 - 8.5 g/dL   Albumin 4.4 3.9 - 4.9 g/dL   Globulin, Total 4.0 1.5 - 4.5 g/dL   Bilirubin Total 0.5 0.0 - 1.2 mg/dL   Alkaline Phosphatase 66 47 - 123 IU/L   AST 11 0 - 40 IU/L   ALT 29 0 - 44 IU/L  Lipid panel     Status: None   Collection Time: 09/22/24 10:19 AM  Result Value Ref Range   Cholesterol, Total 149 100 - 199 mg/dL    Triglycerides 79 0 - 149 mg/dL   HDL 46 >60 mg/dL   VLDL Cholesterol Cal 15 5 - 40 mg/dL   LDL Chol Calc (NIH) 88 0 - 99 mg/dL   Chol/HDL Ratio 3.2 0.0 - 5.0 ratio    Comment:                                   T. Chol/HDL Ratio                                             Men  Women                               1/2 Avg.Risk  3.4    3.3                                   Avg.Risk  5.0    4.4  2X Avg.Risk  9.6    7.1                                3X Avg.Risk 23.4   11.0       Assessment & Plan:  Messiyah was seen today for follow-up.  Hyperglycemia -     Hemoglobin A1c  Prediabetes -     Hemoglobin A1c  Primary hypertension -     Olmesartan  Medoxomil-HCTZ; Take 1 tablet by mouth daily.  Dispense: 30 tablet; Refill: 2  Mixed hyperlipidemia -     Comprehensive metabolic panel with GFR  Other specified hypothyroidism -     TSH    Problem List Items Addressed This Visit       Cardiovascular and Mediastinum   Primary hypertension   Relevant Medications   olmesartan -hydrochlorothiazide (BENICAR  HCT) 40-12.5 MG tablet     Other   Mixed hyperlipidemia   Relevant Medications   olmesartan -hydrochlorothiazide (BENICAR  HCT) 40-12.5 MG tablet   Prediabetes   Relevant Orders   Hemoglobin A1c   Other Visit Diagnoses       Hyperglycemia    -  Primary   Relevant Orders   Hemoglobin A1c     Other specified hypothyroidism           Return in about 3 months (around 12/23/2024) for fu with labs prior.   Total time spent: 20 minutes. This time includes review of previous notes and results and patient face to face interaction during today'Dnasia Gauna visit.    Sherrill Cinderella Perry, MD  09/24/2024   This document may have been prepared by [facility] Voice Recognition software and as such may include unintentional dictation errors.      [1] No Known Allergies [2]  Outpatient Medications Prior to Visit  Medication Sig   colchicine  0.6 MG tablet Take  1 tablet (0.6 mg total) by mouth daily. Take 1st pill today then another pill 2 hrs after, then daily from the next day until pain free.   finasteride  (PROSCAR ) 5 MG tablet Take 1 tablet (5 mg total) by mouth daily.   folic acid  (FOLVITE ) 1 MG tablet Take 1 tablet (1 mg total) by mouth daily.   furosemide  (LASIX ) 20 MG tablet Take 1 tablet (20 mg total) by mouth daily.   hydrALAZINE  (APRESOLINE ) 25 MG tablet Take 1 tablet (25 mg total) by mouth 2 (two) times daily.   levothyroxine  (SYNTHROID ) 100 MCG tablet TAKE ONE TABLET BY MOUTH ONCE DAILY BEFORE BREAKFAST   magnesium oxide (MAG-OX) 400 (240 Mg) MG tablet Take 400 mg by mouth daily.   rivaroxaban  (XARELTO ) 10 MG TABS tablet Take 1 tablet (10 mg total) by mouth daily.   rosuvastatin  (CRESTOR ) 5 MG tablet TAKE 1 TABLET BY MOUTH AT BEDTIME.   tadalafil  (CIALIS ) 5 MG tablet Take 1-2 tablets (5-10 mg total) by mouth daily as needed for erectile dysfunction.   tamsulosin  (FLOMAX ) 0.4 MG CAPS capsule Take 1 capsule (0.4 mg total) by mouth daily.   vitamin B-12 (CYANOCOBALAMIN) 50 MCG tablet Take 50 mcg by mouth daily.   VITAMIN D PO Take 2,000 Units by mouth daily.   Zinc Citrate-Phytase (ZYTAZE) 25-500 MG CAPS Take 25-500 mg by mouth daily.   [DISCONTINUED] olmesartan -hydrochlorothiazide (BENICAR  HCT) 40-12.5 MG tablet Take 1 tablet by mouth daily.   prednisoLONE acetate (PRED FORTE) 1 % ophthalmic suspension Place 1 drop into both eyes 4 (four) times daily. (Patient not taking:  Reported on 09/24/2024)   No facility-administered medications prior to visit.   "

## 2024-09-25 LAB — HEMOGLOBIN A1C
Est. average glucose Bld gHb Est-mCnc: 91 mg/dL
Hgb A1c MFr Bld: 4.8 % (ref 4.8–5.6)

## 2024-09-27 ENCOUNTER — Ambulatory Visit: Payer: Self-pay | Admitting: Internal Medicine

## 2024-12-24 ENCOUNTER — Ambulatory Visit: Admitting: Internal Medicine

## 2025-01-18 ENCOUNTER — Ambulatory Visit: Admitting: Urology
